# Patient Record
Sex: Female | Born: 1950 | Race: White | Hispanic: No | Marital: Married | Smoking: Never smoker
Health system: Southern US, Community
[De-identification: ages and names within clinical notes are randomized; demographics above are authoritative.]

## PROBLEM LIST (undated history)

## (undated) DIAGNOSIS — E669 Obesity, unspecified: Secondary | ICD-10-CM

## (undated) DIAGNOSIS — M47816 Spondylosis without myelopathy or radiculopathy, lumbar region: Secondary | ICD-10-CM

## (undated) DIAGNOSIS — I10 Essential (primary) hypertension: Secondary | ICD-10-CM

## (undated) DIAGNOSIS — J683 Other acute and subacute respiratory conditions due to chemicals, gases, fumes and vapors: Secondary | ICD-10-CM

## (undated) DIAGNOSIS — M1990 Unspecified osteoarthritis, unspecified site: Secondary | ICD-10-CM

## (undated) DIAGNOSIS — I2510 Atherosclerotic heart disease of native coronary artery without angina pectoris: Secondary | ICD-10-CM

## (undated) DIAGNOSIS — F32A Depression, unspecified: Secondary | ICD-10-CM

## (undated) DIAGNOSIS — F329 Major depressive disorder, single episode, unspecified: Secondary | ICD-10-CM

## (undated) DIAGNOSIS — M722 Plantar fascial fibromatosis: Secondary | ICD-10-CM

## (undated) HISTORY — DX: Essential (primary) hypertension: I10

## (undated) HISTORY — PX: OTHER SURGICAL HISTORY: SHX169

## (undated) HISTORY — DX: Major depressive disorder, single episode, unspecified: F32.9

## (undated) HISTORY — DX: Plantar fascial fibromatosis: M72.2

## (undated) HISTORY — DX: Depression, unspecified: F32.A

## (undated) HISTORY — DX: Unspecified osteoarthritis, unspecified site: M19.90

## (undated) HISTORY — PX: CARDIAC CATHETERIZATION: SHX172

## (undated) HISTORY — PX: APPENDECTOMY: SHX54

## (undated) HISTORY — DX: Atherosclerotic heart disease of native coronary artery without angina pectoris: I25.10

## (undated) HISTORY — DX: Obesity, unspecified: E66.9

## (undated) HISTORY — DX: Spondylosis without myelopathy or radiculopathy, lumbar region: M47.816

## (undated) HISTORY — DX: Other acute and subacute respiratory conditions due to chemicals, gases, fumes and vapors: J68.3

---

## 2004-06-01 ENCOUNTER — Encounter: Payer: Self-pay | Admitting: Family Medicine

## 2006-03-18 ENCOUNTER — Ambulatory Visit: Payer: Self-pay | Admitting: Family Medicine

## 2006-03-18 DIAGNOSIS — G4733 Obstructive sleep apnea (adult) (pediatric): Secondary | ICD-10-CM | POA: Insufficient documentation

## 2006-03-18 DIAGNOSIS — M1990 Unspecified osteoarthritis, unspecified site: Secondary | ICD-10-CM | POA: Insufficient documentation

## 2006-04-10 ENCOUNTER — Encounter: Payer: Self-pay | Admitting: Family Medicine

## 2006-04-17 ENCOUNTER — Encounter: Payer: Self-pay | Admitting: Family Medicine

## 2006-05-22 ENCOUNTER — Ambulatory Visit: Payer: Self-pay | Admitting: Family Medicine

## 2006-06-14 ENCOUNTER — Ambulatory Visit: Payer: Self-pay | Admitting: Family Medicine

## 2006-06-20 ENCOUNTER — Ambulatory Visit: Payer: Self-pay | Admitting: Family Medicine

## 2006-06-20 ENCOUNTER — Other Ambulatory Visit: Admission: RE | Admit: 2006-06-20 | Discharge: 2006-06-20 | Payer: Self-pay | Admitting: Family Medicine

## 2006-06-20 ENCOUNTER — Encounter: Payer: Self-pay | Admitting: Family Medicine

## 2006-06-21 ENCOUNTER — Encounter: Payer: Self-pay | Admitting: Family Medicine

## 2006-06-24 ENCOUNTER — Encounter: Payer: Self-pay | Admitting: Family Medicine

## 2006-06-25 ENCOUNTER — Telehealth: Payer: Self-pay | Admitting: Family Medicine

## 2006-06-25 LAB — CONVERTED CEMR LAB
Alkaline Phosphatase: 53 units/L (ref 39–117)
BUN: 29 mg/dL — ABNORMAL HIGH (ref 6–23)
CO2: 26 meq/L (ref 19–32)
Chloride: 107 meq/L (ref 96–112)
Glucose, Bld: 94 mg/dL (ref 70–99)
LDL Cholesterol: 109 mg/dL — ABNORMAL HIGH (ref 0–99)
Sodium: 144 meq/L (ref 135–145)
TSH: 0.721 microintl units/mL (ref 0.350–5.50)
Total CHOL/HDL Ratio: 3.9

## 2006-07-02 ENCOUNTER — Ambulatory Visit: Payer: Self-pay | Admitting: Family Medicine

## 2006-07-15 ENCOUNTER — Telehealth: Payer: Self-pay | Admitting: Family Medicine

## 2006-07-31 ENCOUNTER — Encounter: Payer: Self-pay | Admitting: Family Medicine

## 2006-09-24 ENCOUNTER — Ambulatory Visit: Payer: Self-pay | Admitting: Family Medicine

## 2006-09-27 ENCOUNTER — Telehealth: Payer: Self-pay | Admitting: Family Medicine

## 2006-11-18 ENCOUNTER — Ambulatory Visit: Payer: Self-pay | Admitting: Family Medicine

## 2006-11-19 ENCOUNTER — Encounter: Payer: Self-pay | Admitting: Family Medicine

## 2006-12-05 ENCOUNTER — Ambulatory Visit: Payer: Self-pay | Admitting: Family Medicine

## 2007-01-14 ENCOUNTER — Telehealth: Payer: Self-pay | Admitting: Family Medicine

## 2007-02-06 ENCOUNTER — Ambulatory Visit: Payer: Self-pay | Admitting: Family Medicine

## 2007-02-06 DIAGNOSIS — M654 Radial styloid tenosynovitis [de Quervain]: Secondary | ICD-10-CM | POA: Insufficient documentation

## 2007-02-11 ENCOUNTER — Encounter: Payer: Self-pay | Admitting: Family Medicine

## 2007-02-12 ENCOUNTER — Encounter: Payer: Self-pay | Admitting: Family Medicine

## 2007-03-27 ENCOUNTER — Ambulatory Visit: Payer: Self-pay | Admitting: Family Medicine

## 2007-03-31 ENCOUNTER — Telehealth: Payer: Self-pay | Admitting: Family Medicine

## 2007-04-10 ENCOUNTER — Telehealth: Payer: Self-pay | Admitting: Family Medicine

## 2007-08-12 LAB — CONVERTED CEMR LAB: Pap Smear: NORMAL

## 2007-10-29 ENCOUNTER — Ambulatory Visit: Payer: Self-pay | Admitting: Family Medicine

## 2007-10-29 DIAGNOSIS — J45909 Unspecified asthma, uncomplicated: Secondary | ICD-10-CM | POA: Insufficient documentation

## 2007-11-05 ENCOUNTER — Ambulatory Visit: Payer: Self-pay | Admitting: Family Medicine

## 2007-11-05 ENCOUNTER — Encounter: Admission: RE | Admit: 2007-11-05 | Discharge: 2007-11-05 | Payer: Self-pay | Admitting: Family Medicine

## 2007-11-20 ENCOUNTER — Telehealth: Payer: Self-pay | Admitting: Family Medicine

## 2007-11-21 ENCOUNTER — Encounter: Admission: RE | Admit: 2007-11-21 | Discharge: 2007-11-21 | Payer: Self-pay | Admitting: Family Medicine

## 2007-11-25 LAB — HM MAMMOGRAPHY: HM Mammogram: NORMAL

## 2008-03-11 ENCOUNTER — Ambulatory Visit: Payer: Self-pay | Admitting: Family Medicine

## 2008-03-19 ENCOUNTER — Ambulatory Visit: Payer: Self-pay | Admitting: Family Medicine

## 2008-03-31 LAB — HM COLONOSCOPY

## 2008-04-01 ENCOUNTER — Encounter: Payer: Self-pay | Admitting: Family Medicine

## 2008-04-07 ENCOUNTER — Ambulatory Visit: Payer: Self-pay | Admitting: Family Medicine

## 2008-04-07 ENCOUNTER — Encounter: Admission: RE | Admit: 2008-04-07 | Discharge: 2008-04-07 | Payer: Self-pay | Admitting: Family Medicine

## 2008-04-08 ENCOUNTER — Encounter: Payer: Self-pay | Admitting: Family Medicine

## 2008-04-08 LAB — CONVERTED CEMR LAB
AST: 24 units/L (ref 0–37)
Albumin: 4.7 g/dL (ref 3.5–5.2)
Alkaline Phosphatase: 59 units/L (ref 39–117)
Basophils Absolute: 0 10*3/uL (ref 0.0–0.1)
Chloride: 106 meq/L (ref 96–112)
Cholesterol: 213 mg/dL — ABNORMAL HIGH (ref 0–200)
Creatinine, Ser: 0.76 mg/dL (ref 0.40–1.20)
Eosinophils Absolute: 0.3 10*3/uL (ref 0.0–0.7)
Eosinophils Relative: 6 % — ABNORMAL HIGH (ref 0–5)
Hemoglobin: 14.2 g/dL (ref 12.0–15.0)
Lymphocytes Relative: 30 % (ref 12–46)
MCHC: 34 g/dL (ref 30.0–36.0)
Monocytes Relative: 8 % (ref 3–12)
Neutro Abs: 3.4 10*3/uL (ref 1.7–7.7)
Neutrophils Relative %: 55 % (ref 43–77)
Potassium: 4.2 meq/L (ref 3.5–5.3)
RBC: 4.94 M/uL (ref 3.87–5.11)
Sodium: 142 meq/L (ref 135–145)
Total Protein: 7.1 g/dL (ref 6.0–8.3)

## 2008-04-10 ENCOUNTER — Encounter: Admission: RE | Admit: 2008-04-10 | Discharge: 2008-04-10 | Payer: Self-pay | Admitting: Family Medicine

## 2008-04-12 ENCOUNTER — Encounter: Payer: Self-pay | Admitting: Family Medicine

## 2008-04-19 ENCOUNTER — Ambulatory Visit: Payer: Self-pay | Admitting: Family Medicine

## 2008-04-19 DIAGNOSIS — M4802 Spinal stenosis, cervical region: Secondary | ICD-10-CM | POA: Insufficient documentation

## 2008-05-17 ENCOUNTER — Telehealth: Payer: Self-pay | Admitting: Family Medicine

## 2008-05-18 ENCOUNTER — Ambulatory Visit: Payer: Self-pay | Admitting: Family Medicine

## 2008-06-16 ENCOUNTER — Encounter: Payer: Self-pay | Admitting: Family Medicine

## 2008-06-16 ENCOUNTER — Encounter: Admission: RE | Admit: 2008-06-16 | Discharge: 2008-06-16 | Payer: Self-pay | Admitting: Sports Medicine

## 2008-06-18 ENCOUNTER — Encounter: Admission: RE | Admit: 2008-06-18 | Discharge: 2008-07-23 | Payer: Self-pay | Admitting: Sports Medicine

## 2008-09-02 ENCOUNTER — Ambulatory Visit: Payer: Self-pay | Admitting: Family Medicine

## 2008-09-06 ENCOUNTER — Ambulatory Visit: Payer: Self-pay | Admitting: Family Medicine

## 2008-09-06 DIAGNOSIS — E669 Obesity, unspecified: Secondary | ICD-10-CM | POA: Insufficient documentation

## 2008-12-17 ENCOUNTER — Telehealth: Payer: Self-pay | Admitting: Family Medicine

## 2009-03-04 ENCOUNTER — Telehealth: Payer: Self-pay | Admitting: Family Medicine

## 2009-04-19 ENCOUNTER — Encounter: Admission: RE | Admit: 2009-04-19 | Discharge: 2009-04-19 | Payer: Self-pay | Admitting: Family Medicine

## 2009-04-19 ENCOUNTER — Telehealth (INDEPENDENT_AMBULATORY_CARE_PROVIDER_SITE_OTHER): Payer: Self-pay | Admitting: *Deleted

## 2009-04-19 ENCOUNTER — Ambulatory Visit: Payer: Self-pay | Admitting: Family Medicine

## 2009-04-19 DIAGNOSIS — M545 Low back pain, unspecified: Secondary | ICD-10-CM | POA: Insufficient documentation

## 2009-05-02 ENCOUNTER — Telehealth: Payer: Self-pay | Admitting: Family Medicine

## 2009-07-15 ENCOUNTER — Encounter: Admission: RE | Admit: 2009-07-15 | Discharge: 2009-07-15 | Payer: Self-pay | Admitting: Family Medicine

## 2009-07-15 ENCOUNTER — Ambulatory Visit: Payer: Self-pay | Admitting: Family Medicine

## 2009-07-15 DIAGNOSIS — R109 Unspecified abdominal pain: Secondary | ICD-10-CM | POA: Insufficient documentation

## 2009-07-15 LAB — CONVERTED CEMR LAB
Glucose, Urine, Semiquant: NEGATIVE
HCT: 40.2 % (ref 36.0–46.0)
Hemoglobin: 13.6 g/dL (ref 12.0–15.0)
Lymphocytes Relative: 31 % (ref 12–46)
MCV: 85.9 fL (ref 78.0–100.0)
Monocytes Absolute: 0.5 10*3/uL (ref 0.1–1.0)
Neutro Abs: 4 10*3/uL (ref 1.7–7.7)
Platelets: 172 10*3/uL (ref 150–400)
Protein, U semiquant: NEGATIVE
RBC: 4.68 M/uL (ref 3.87–5.11)
RDW: 12.2 % (ref 11.5–15.5)
Urobilinogen, UA: 0.2
WBC Urine, dipstick: NEGATIVE
pH: 7

## 2009-07-15 IMAGING — CR DG ABDOMEN 1V
1 series · 1 of 1 positions shown · non-contrast
Comparison: Plain film [DATE]

CLINICAL DATA: Right lower quadrant pain

ABDOMEN - 1 VIEW

[view not recorded]
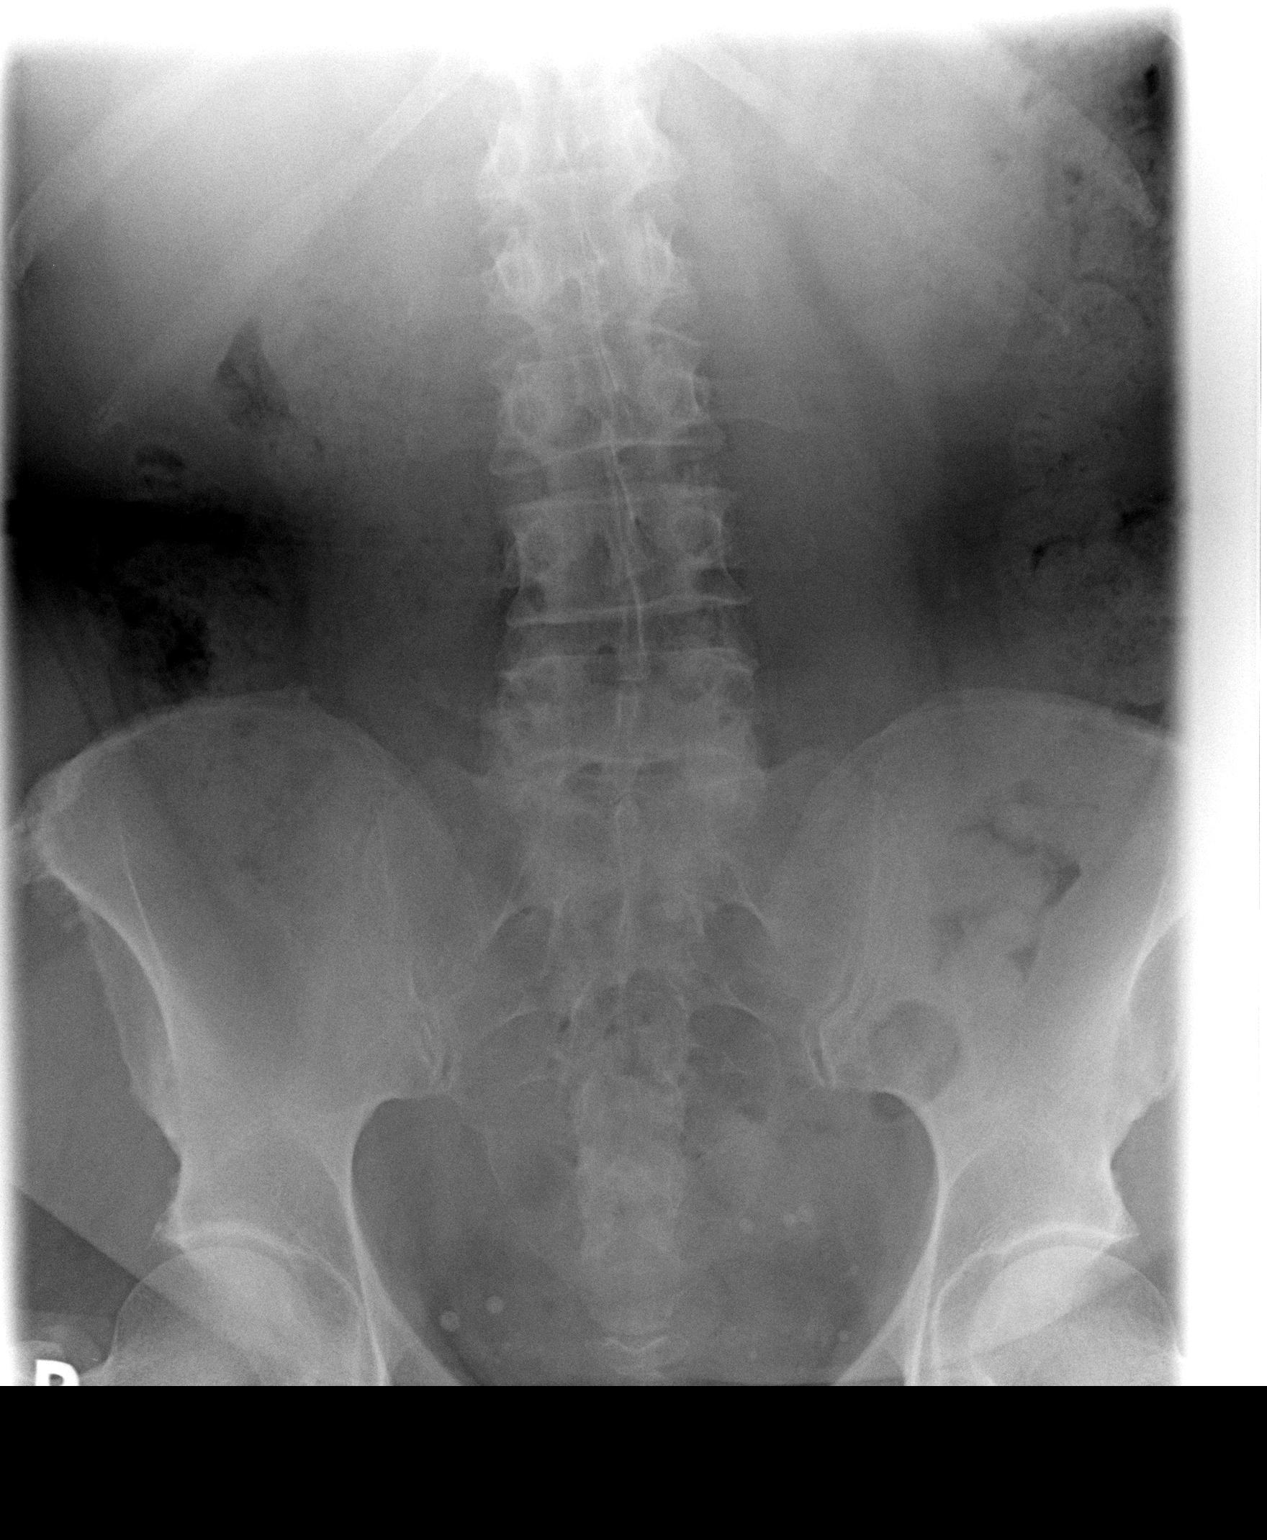

[1 of 1 positions shown; findings below may reference images not displayed]

FINDINGS: There is a moderate volume of stool within the ascending
and descending colon.  No stool in distal rectosigmoid colon.
There is mild dextroscoliosis of the lumbar spine.  Vascular
calcification in  lower pelvis.
IMPRESSION: Moderate volume of stool throughout the colon may represent
constipation.

## 2009-07-18 LAB — CONVERTED CEMR LAB
Albumin: 4.6 g/dL (ref 3.5–5.2)
Alkaline Phosphatase: 53 units/L (ref 39–117)
BUN: 18 mg/dL (ref 6–23)
CO2: 24 meq/L (ref 19–32)
Calcium: 9.8 mg/dL (ref 8.4–10.5)
Creatinine, Ser: 0.76 mg/dL (ref 0.40–1.20)
Glucose, Bld: 95 mg/dL (ref 70–99)
Potassium: 4.8 meq/L (ref 3.5–5.3)
Total Bilirubin: 0.5 mg/dL (ref 0.3–1.2)
Total Protein: 6.4 g/dL (ref 6.0–8.3)

## 2009-07-19 ENCOUNTER — Ambulatory Visit: Payer: Self-pay | Admitting: Family Medicine

## 2009-07-19 ENCOUNTER — Encounter: Admission: RE | Admit: 2009-07-19 | Discharge: 2009-07-19 | Payer: Self-pay | Admitting: Family Medicine

## 2009-08-16 ENCOUNTER — Other Ambulatory Visit: Admission: RE | Admit: 2009-08-16 | Discharge: 2009-08-16 | Payer: Self-pay | Admitting: Family Medicine

## 2009-08-16 ENCOUNTER — Ambulatory Visit: Payer: Self-pay | Admitting: Family Medicine

## 2009-08-16 DIAGNOSIS — I10 Essential (primary) hypertension: Secondary | ICD-10-CM | POA: Insufficient documentation

## 2009-08-19 LAB — CONVERTED CEMR LAB: Pap Smear: NEGATIVE

## 2009-08-23 ENCOUNTER — Encounter (INDEPENDENT_AMBULATORY_CARE_PROVIDER_SITE_OTHER): Payer: Self-pay | Admitting: *Deleted

## 2009-08-23 ENCOUNTER — Encounter: Admission: RE | Admit: 2009-08-23 | Discharge: 2009-08-23 | Payer: Self-pay | Admitting: Family Medicine

## 2009-08-30 ENCOUNTER — Ambulatory Visit: Payer: Self-pay | Admitting: Obstetrics & Gynecology

## 2009-09-06 ENCOUNTER — Ambulatory Visit: Payer: Self-pay | Admitting: Obstetrics & Gynecology

## 2009-09-20 ENCOUNTER — Ambulatory Visit: Payer: Self-pay | Admitting: Obstetrics & Gynecology

## 2009-09-21 ENCOUNTER — Encounter: Payer: Self-pay | Admitting: Obstetrics & Gynecology

## 2009-09-21 LAB — CONVERTED CEMR LAB: Yeast Wet Prep HPF POC: NONE SEEN

## 2009-11-21 ENCOUNTER — Encounter: Admission: RE | Admit: 2009-11-21 | Discharge: 2009-11-21 | Payer: Self-pay | Admitting: Obstetrics & Gynecology

## 2009-11-30 ENCOUNTER — Ambulatory Visit: Payer: Self-pay | Admitting: Obstetrics & Gynecology

## 2010-01-23 ENCOUNTER — Ambulatory Visit: Payer: Self-pay | Admitting: Obstetrics & Gynecology

## 2010-01-23 ENCOUNTER — Ambulatory Visit (HOSPITAL_COMMUNITY): Admission: RE | Admit: 2010-01-23 | Discharge: 2010-01-23 | Payer: Self-pay | Admitting: Obstetrics & Gynecology

## 2010-02-07 ENCOUNTER — Ambulatory Visit: Payer: Self-pay | Admitting: Obstetrics & Gynecology

## 2010-02-18 ENCOUNTER — Encounter: Payer: Self-pay | Admitting: Family Medicine

## 2010-03-08 ENCOUNTER — Ambulatory Visit: Payer: Self-pay | Admitting: Family Medicine

## 2010-03-08 DIAGNOSIS — Z8659 Personal history of other mental and behavioral disorders: Secondary | ICD-10-CM | POA: Insufficient documentation

## 2010-03-08 DIAGNOSIS — G44209 Tension-type headache, unspecified, not intractable: Secondary | ICD-10-CM | POA: Insufficient documentation

## 2010-03-16 ENCOUNTER — Ambulatory Visit: Payer: Self-pay | Admitting: Diagnostic Radiology

## 2010-03-16 ENCOUNTER — Ambulatory Visit: Payer: Self-pay | Admitting: Cardiovascular Disease

## 2010-03-16 ENCOUNTER — Encounter: Payer: Self-pay | Admitting: Emergency Medicine

## 2010-03-16 ENCOUNTER — Inpatient Hospital Stay (HOSPITAL_COMMUNITY): Admission: AD | Admit: 2010-03-16 | Discharge: 2010-03-17 | Payer: Self-pay | Admitting: Cardiovascular Disease

## 2010-03-27 ENCOUNTER — Ambulatory Visit: Payer: Self-pay | Admitting: Family Medicine

## 2010-03-27 DIAGNOSIS — R079 Chest pain, unspecified: Secondary | ICD-10-CM | POA: Insufficient documentation

## 2010-04-10 ENCOUNTER — Encounter: Payer: Self-pay | Admitting: Family Medicine

## 2010-04-14 ENCOUNTER — Ambulatory Visit: Payer: Self-pay | Admitting: Family Medicine

## 2010-04-14 DIAGNOSIS — J069 Acute upper respiratory infection, unspecified: Secondary | ICD-10-CM | POA: Insufficient documentation

## 2010-04-14 DIAGNOSIS — L509 Urticaria, unspecified: Secondary | ICD-10-CM | POA: Insufficient documentation

## 2010-04-17 ENCOUNTER — Encounter: Admission: RE | Admit: 2010-04-17 | Discharge: 2010-04-19 | Payer: Self-pay | Admitting: Orthopedic Surgery

## 2010-05-04 ENCOUNTER — Telehealth (INDEPENDENT_AMBULATORY_CARE_PROVIDER_SITE_OTHER): Payer: Self-pay | Admitting: *Deleted

## 2010-05-05 ENCOUNTER — Encounter: Payer: Self-pay | Admitting: Family Medicine

## 2010-05-08 LAB — CONVERTED CEMR LAB
Cholesterol: 172 mg/dL (ref 0–200)
Glucose, Bld: 95 mg/dL (ref 70–99)
Total CHOL/HDL Ratio: 3.1
Triglycerides: 122 mg/dL (ref <150)

## 2010-05-25 ENCOUNTER — Encounter: Payer: Self-pay | Admitting: Family Medicine

## 2010-06-07 ENCOUNTER — Encounter: Payer: Self-pay | Admitting: Family Medicine

## 2010-06-27 NOTE — Letter (Signed)
[facility]   Im[facility] By: Lanelle Bal 04/24/2010 10:06:01  _____________________________________________________________________  External Attachment:    Type:   Image     Comment:   External Document

## 2010-06-27 NOTE — Assessment & Plan Note (Signed)
Summary: URI   Vital Signs:  Patient profile:   60 year old female Height:      66.25 inches Weight:      222 pounds Temp:     98.5 degrees F oral Pulse rate:   60 / minute BP sitting:   119 / 66  (right arm) Cuff size:   large  Vitals Entered By: Avon Gully CMA, Duncan Dull) (April 14, 2010 1:44 PM) CC: cough since last thurday   Primary Care Provider:  Seymour Bars D.O.  CC:  cough since last thurday.  History of Present Illness: About 10 days ago had sinus pain and upper teeth pain so it finally resolved after using Tyelnol. Then a cough started about 8 days ago.  Got worse over the weekend. Seen about 5 days ago and put  amoxcicillin  and tessaslon perles. Last night felt feverish. Still coughing.  This morning felt she was wheezing and upper chest feels sore with A DEP BREATH. More on the right side.  Still on the ABX. No sinus sxs.  Using Delsym too.   Current Medications (verified): 1)  Ambien 10 Mg Tabs (Zolpidem Tartrate) .Marland Kitchen.. 1 Tab By Mouth At Bedtime As Needed Sleep 2)  Meloxicam 15 Mg Tabs (Meloxicam) .Marland Kitchen.. 1 Tab By Mouth Qam With Food 3)  Citalopram Hydrobromide 20 Mg Tabs (Citalopram Hydrobromide) .Marland Kitchen.. 1 Tab By Mouth Qhs 4)  Azor 5-20 Mg Tabs (Amlodipine-Olmesartan) .Marland Kitchen.. 1 Tab By Mouth Daily 5)  Pravachol 40 Mg Tabs (Pravastatin Sodium) .... Take 1 Tab By Mouth At Bedtime 6)  Aspirin 81 Mg Tbec (Aspirin) .... Take 1 Tab By Mouth Once Daily 7)  Fish Oil 1000 Mg Caps (Omega-3 Fatty Acids) .... Take 1 Capsule By Mouth Once Daily 8)  Flexeril 10 Mg Tabs (Cyclobenzaprine Hcl) .... Take 1 Tab By Mouth Once Daily 9)  Glucosamine 500 Mg Caps (Glucosamine Sulfate) .... Take 2 Tab By Mouth Once Daily 10)  Naproxen 250 Mg Tabs (Naproxen) .... Take 4 Tabs By Mouth Two Times A Day As Needed 11)  Tylenol Extra Strength 500 Mg Tabs (Acetaminophen) .... Take 1-2 Tabs By Mouth Every 6 Hours As Needed 12)  Amoxicillin 875 Mg Tabs (Amoxicillin) .... Take One Tablet By Mouth Two Times  A Day 13)  Tessalon Perles 100 Mg Caps (Benzonatate)  Allergies (verified): 1)  ! Alfonso Patten  Comments:  Nurse/Medical Assistant: The patient's medications and allergies were reviewed with the patient and were updated in the Medication and Allergy Lists. Avon Gully CMA, Duncan Dull) (April 14, 2010 1:47 PM)  Physical Exam  General:  Well-developed,well-nourished,in no acute distress; alert,appropriate and cooperative throughout examination Head:  Normocephalic and atraumatic without obvious abnormalities. No apparent alopecia or balding. Eyes:  No corneal or conjunctival inflammation noted. EOMI. Perrla. Ears:  External ear exam shows no significant lesions or deformities.  Otoscopic examination reveals clear canals, tympanic membranes are intact bilaterally without bulging, retraction, inflammation or discharge. Hearing is grossly normal bilaterally. Nose:  External nasal examination shows no deformity or inflammation. Nasal mucosa are pink and moist without lesions or exudates. Mouth:  Oral mucosa and oropharynx without lesions or exudates.  Teeth in good repair. Neck:  No deformities, masses, or tenderness noted. Lungs:  Normal respiratory effort, chest expands symmetrically. Lungs are clear to auscultation, no crackles or wheezes. Heart:  Normal rate and regular rhythm. S1 and S2 normal without gallop, murmur, click, rub or other extra sounds. Skin:  no rashes.  no rashes.   Cervical Nodes:  No lymphadenopathy noted Psych:  Cognition and judgment appear intact. Alert and cooperative with normal attention span and concentration. No apparent delusions, illusions, hallucinations   Impression & Recommendations:  Problem # 1:  URI (ICD-465.9) I let her know this is probably viral which is why she is not better on the amoxicillin.  Let her know ill likely take 3 weeks to resolved. Gave her cough med for bedtime. Can fil zpack if not better in one week since leaving town for  Thanksgiving.  Her updated medication list for this problem includes:    Meloxicam 15 Mg Tabs (Meloxicam) .Marland Kitchen... 1 tab by mouth qam with food    Aspirin 81 Mg Tbec (Aspirin) .Marland Kitchen... Take 1 tab by mouth once daily    Naproxen 250 Mg Tabs (Naproxen) .Marland Kitchen... Take 4 tabs by mouth two times a day as needed    Tylenol Extra Strength 500 Mg Tabs (Acetaminophen) .Marland Kitchen... Take 1-2 tabs by mouth every 6 hours as needed    Tessalon Perles 100 Mg Caps (Benzonatate)    Hydrocodone-homatropine 5-1.5 Mg/36ml Syrp (Hydrocodone-homatropine) .Marland KitchenMarland KitchenMarland KitchenMarland Kitchen 5ml by mouth at bedtime  Instructed on symptomatic treatment. Call if symptoms persist or worsen.   Her updated medication list for this problem includes:    Meloxicam 15 Mg Tabs (Meloxicam) .Marland Kitchen... 1 tab by mouth qam with food    Aspirin 81 Mg Tbec (Aspirin) .Marland Kitchen... Take 1 tab by mouth once daily    Naproxen 250 Mg Tabs (Naproxen) .Marland Kitchen... Take 4 tabs by mouth two times a day as needed    Tylenol Extra Strength 500 Mg Tabs (Acetaminophen) .Marland Kitchen... Take 1-2 tabs by mouth every 6 hours as needed    Tessalon Perles 100 Mg Caps (Benzonatate)    Hydrocodone-homatropine 5-1.5 Mg/29ml Syrp (Hydrocodone-homatropine) .Marland KitchenMarland KitchenMarland KitchenMarland Kitchen 5ml by mouth at bedtime  Complete Medication List: 1)  Ambien 10 Mg Tabs (Zolpidem tartrate) .Marland Kitchen.. 1 tab by mouth at bedtime as needed sleep 2)  Meloxicam 15 Mg Tabs (Meloxicam) .Marland Kitchen.. 1 tab by mouth qam with food 3)  Citalopram Hydrobromide 20 Mg Tabs (Citalopram hydrobromide) .Marland Kitchen.. 1 tab by mouth qhs 4)  Azor 5-20 Mg Tabs (Amlodipine-olmesartan) .Marland Kitchen.. 1 tab by mouth daily 5)  Pravachol 40 Mg Tabs (Pravastatin sodium) .... Take 1 tab by mouth at bedtime 6)  Aspirin 81 Mg Tbec (Aspirin) .... Take 1 tab by mouth once daily 7)  Fish Oil 1000 Mg Caps (Omega-3 fatty acids) .... Take 1 capsule by mouth once daily 8)  Flexeril 10 Mg Tabs (Cyclobenzaprine hcl) .... Take 1 tab by mouth once daily 9)  Glucosamine 500 Mg Caps (Glucosamine sulfate) .... Take 2 tab by mouth once  daily 10)  Naproxen 250 Mg Tabs (Naproxen) .... Take 4 tabs by mouth two times a day as needed 11)  Tylenol Extra Strength 500 Mg Tabs (Acetaminophen) .... Take 1-2 tabs by mouth every 6 hours as needed 12)  Amoxicillin 875 Mg Tabs (Amoxicillin) .... Take one tablet by mouth two times a day 13)  Tessalon Perles 100 Mg Caps (Benzonatate) 14)  Hydrocodone-homatropine 5-1.5 Mg/33ml Syrp (Hydrocodone-homatropine) .... 5ml by mouth at bedtime 15)  Zithromax Z-pak 250 Mg Tabs (Azithromycin) .... Take as directed. Prescriptions: ZITHROMAX Z-PAK 250 MG TABS (AZITHROMYCIN) Take as directed.  #1 pack x 0   Entered and Authorized by:   Nani Gasser MD   Signed by:   Nani Gasser MD on 04/14/2010   Method used:   Print then Give to Patient   RxID:   [id number] HYDROCODONE-HOMATROPINE 5-1.5  MG/5ML SYRP (HYDROCODONE-HOMATROPINE) 5ml by mouth at bedtime  #150 ml x 0   Entered and Authorized by:   Nani Gasser MD   Signed by:   Nani Gasser MD on 04/14/2010   Method used:   Printed then faxed to ...       [facility]* (retail)       [facility] [place], [place]  [address]       Ph: [phone]       Fax: [phone]   RxID:   [id number]    Orders Added: 1)  Est. Patient Level III [70623]

## 2010-06-27 NOTE — Assessment & Plan Note (Signed)
Summary: HFU for chest pain   Vital Signs:  Patient profile:   60 year old female Height:      66.25 inches Weight:      217 pounds BMI:     34.89 O2 Sat:      95 % on Room air Pulse rate:   65 / minute BP sitting:   130 / 65  (left arm) Cuff size:   large  Vitals Entered By: Payton Spark CMA (March 27, 2010 1:44 PM)  O2 Flow:  Room air CC: Hosp F/U. On Prednisone taper and ABX per podiatrist.    Primary Care Provider:  Seymour Bars D.O.  CC:  Hosp F/U. On Prednisone taper and ABX per podiatrist. .  History of Present Illness: 60 yo WF presents for HFU visit.  She was admitted to [facility] 10-20 to 10/21 for chest pain.  She was cathed on 10-20 and she had mild coronary plaquing with normal LV function.  She was started on Pravastatin.  The event was felt to be from coronary or esophageal spasm.    CCB was considered, but her HR was too low. She is feeling well now.  Denies CP, DOE, leg swelling, palpitations or heartburn.  She'd like to restart an exercise program.  Still having a little pain at her cath site.   Waist Circ 41"   Current Medications (verified): 1)  Ambien 10 Mg Tabs (Zolpidem Tartrate) .Marland Kitchen.. 1 Tab By Mouth At Bedtime As Needed Sleep 2)  Meloxicam 15 Mg Tabs (Meloxicam) .Marland Kitchen.. 1 Tab By Mouth Qam With Food 3)  Citalopram Hydrobromide 20 Mg Tabs (Citalopram Hydrobromide) .Marland Kitchen.. 1 Tab By Mouth Qhs 4)  Azor 5-20 Mg Tabs (Amlodipine-Olmesartan) .Marland Kitchen.. 1 Tab By Mouth Daily 5)  Pravachol 40 Mg Tabs (Pravastatin Sodium) .... Take 1 Tab By Mouth At Bedtime 6)  Aspirin 81 Mg Tbec (Aspirin) .... Take 1 Tab By Mouth Once Daily 7)  Fish Oil 1000 Mg Caps (Omega-3 Fatty Acids) .... Take 1 Capsule By Mouth Once Daily 8)  Flexeril 10 Mg Tabs (Cyclobenzaprine Hcl) .... Take 1 Tab By Mouth Once Daily 9)  Glucosamine 500 Mg Caps (Glucosamine Sulfate) .... Take 2 Tab By Mouth Once Daily 10)  Naproxen 250 Mg Tabs (Naproxen) .... Take 4 Tabs By Mouth Two Times A Day As Needed 11)   Tylenol Extra Strength 500 Mg Tabs (Acetaminophen) .... Take 1-2 Tabs By Mouth Every 6 Hours As Needed  Allergies (verified): 1)  ! Alfonso Patten  Past History:  Past Medical History: Osteoarthritis (03/18/2006) reactive airways Lumbar spondylosis depression HTN obesity plantar fasciitis - Dr Sherilyn Cooter in [facility]  Past Surgical History: Appendectomy LTCS L ORIF T&A D&C with hysteroscopy 12-2009 heart cath (Dr Excell Seltzer ) 03-16-2010, mild CAD, normal LV function  Social History: Reviewed history from 03/08/2010 and no changes required. 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  Has associates degree and is married to [place].  Has 3 grown children.  Nonsmoker.  Occas ETOH.  Does cardio and wt lifing 3 x a wk.  Review of Systems      See HPI  Physical Exam  General:  alert, well-developed, well-nourished, and well-hydrated.  obese Head:  normocephalic and atraumatic.   Mouth:  good dentition and pharynx pink and moist.   Lungs:  Normal respiratory effort, chest expands symmetrically. Lungs are clear to auscultation, no crackles or wheezes. Heart:  Normal rate and regular rhythm. S1 and S2 normal without gallop, murmur, click, rub or other extra sounds. Pulses:  2+ radial  pulses Extremities:  no LE edema Skin:  color normal.  cath site R groin, normal with a small resolving hematoma, ecchymoses Cervical Nodes:  No lymphadenopathy noted Psych:  good eye contact, not anxious appearing, and not depressed appearing.     Impression & Recommendations:  Problem # 1:  CHEST PAIN (ICD-786.50) Assessment Improved Reviewed hosp notes and cath notes from 03-16-10. She had mild CAD plaquing with normal LV function.  No further cardiac w/ u recommended. Felt to be either coronary vasospasm or esophageal spasm but her HR was too low for a CCB. She has the green light to exercise. Needs to work on wt loss and cholesterol reduction with LDL goal of <100. Will get labs updated in 2 mos-- on Pravastatin now. BP at goal.     Complete Medication List: 1)  Ambien 10 Mg Tabs (Zolpidem tartrate) .Marland Kitchen.. 1 tab by mouth at bedtime as needed sleep 2)  Meloxicam 15 Mg Tabs (Meloxicam) .Marland Kitchen.. 1 tab by mouth qam with food 3)  Citalopram Hydrobromide 20 Mg Tabs (Citalopram hydrobromide) .Marland Kitchen.. 1 tab by mouth qhs 4)  Azor 5-20 Mg Tabs (Amlodipine-olmesartan) .Marland Kitchen.. 1 tab by mouth daily 5)  Pravachol 40 Mg Tabs (Pravastatin sodium) .... Take 1 tab by mouth at bedtime 6)  Aspirin 81 Mg Tbec (Aspirin) .... Take 1 tab by mouth once daily 7)  Fish Oil 1000 Mg Caps (Omega-3 fatty acids) .... Take 1 capsule by mouth once daily 8)  Flexeril 10 Mg Tabs (Cyclobenzaprine hcl) .... Take 1 tab by mouth once daily 9)  Glucosamine 500 Mg Caps (Glucosamine sulfate) .... Take 2 tab by mouth once daily 10)  Naproxen 250 Mg Tabs (Naproxen) .... Take 4 tabs by mouth two times a day as needed 11)  Tylenol Extra Strength 500 Mg Tabs (Acetaminophen) .... Take 1-2 tabs by mouth every 6 hours as needed  Patient Instructions: 1)  OK to start exercise program. 2)  Use ice packs for R groin pain/ resolving hematoma. 3)  Stay on current meds. 4)  Call for lab order form once off Prednisone. 5)  REturn for f/u cholesterol / BP in 2 mos.   Orders Added: 1)  Est. Patient Level III [16109]

## 2010-06-27 NOTE — Letter (Signed)
Summary: Primary Care Consult Scheduled Letter  [place] [facility]  [address]   [place], [place] [address]   Phone: [phone]  Fax: [phone]      08/23/2009 MRN: [id number]  ABIE KILLIAN [address] [place], [place]  [address]   Dear Ms. Mask,     We have scheduled an appointment for you.  At the recommendation of Dr.BOWEN , we have scheduled you a consult with [facility], [place], DR LEGGETT on APRIL  5,2011 at [facility] .  Their address is_[address],[place]   . The office phone number is [phone].  If this appointment day and time is not convenient for you, please feel free to call the office of the doctor you are being referred to at the number listed above and reschedule the appointment.     It is important for you to keep your scheduled appointments. We are here to make sure you are given good patient care.   Thank you, Darral Dash Patient Care Coordinator [place]

## 2010-06-27 NOTE — Assessment & Plan Note (Signed)
Summary: mood/ BP   Vital Signs:  Patient profile:   60 year old female Height:      66.25 inches Weight:      222 pounds BMI:     35.69 O2 Sat:      96 % on Room air Pulse rate:   66 / minute BP sitting:   176 / 92  (left arm) Cuff size:   large  Vitals Entered By: Payton Spark CMA (March 08, 2010 3:26 PM)  O2 Flow:  Room air CC: Increased stress, elevated blood pressure and HAs.    Primary Care Demetri Goshert:  Seymour Bars D.O.  CC:  Increased stress and elevated blood pressure and HAs. .  History of Present Illness: 60 yo WF presents for elevated BP.  She has been under a lot of stress at work.  She stopped Benazepril because she thought it wasn't working.    She is getting HAs while at work from stress and high BPs.  She is not exercising.  Her diet has been fair but she has not been working on it.  Denies blurry vision, leg edema or heart palpitations.  She enjoys her weekends but admits to being more irritable and having poor sleep.  It has started to affect her relationships at home and at work.      Current Medications (verified): 1)  Ambien 10 Mg Tabs (Zolpidem Tartrate) .Marland Kitchen.. 1 Tab By Mouth At Bedtime As Needed Sleep 2)  Meloxicam 15 Mg Tabs (Meloxicam) .Marland Kitchen.. 1 Tab By Mouth Qam With Food  Allergies (verified): 1)  ! Alfonso Patten  Past History:  Past Medical History: Osteoarthritis (03/18/2006) reactive airways Lumbar spondylosis depression HTN obesity  Past Surgical History: Appendectomy LTCS L ORIF T&A D&C with hysteroscopy 12-2009  Social History: Charity fundraiser for [employer].  Has associates degree and is married to [place].  Has 3 grown children.  Nonsmoker.  Occas ETOH.  Does cardio and wt lifing 3 x a wk.  Review of Systems Psych:  Complains of anxiety, depression, easily angered, and irritability; denies easily tearful and suicidal thoughts/plans.  Physical Exam  General:  alert, well-developed, well-nourished, and well-hydrated.  obese Head:  normocephalic and  atraumatic.   Mouth:  pharynx pink and moist.   Neck:  no masses.   Lungs:  Normal respiratory effort, chest expands symmetrically. Lungs are clear to auscultation, no crackles or wheezes. Heart:  Normal rate and regular rhythm. S1 and S2 normal without gallop, murmur, click, rub or other extra sounds.   Impression & Recommendations:  Problem # 1:  ESSENTIAL HYPERTENSION, BENIGN (ICD-401.1) BP very high.  Leading to HAs.  Treat with Azor once daily.  Will bring in labs from work.  RTC in 6 wks for f/u. The following medications were removed from the medication list:    Benazepril Hcl 10 Mg Tabs (Benazepril hcl) .Marland Kitchen... 1 tab by mouth daily Her updated medication list for this problem includes:    Azor 5-20 Mg Tabs (Amlodipine-olmesartan) .Marland Kitchen... 1 tab by mouth daily  BP today: 176/92 Prior BP: 144/82 (08/16/2009)  Labs Reviewed: K+: 4.8 (07/15/2009) Creat: : 0.76 (07/15/2009)   Chol: 213 (04/08/2008)   HDL: 42 (04/08/2008)   LDL: 133 (04/08/2008)   TG: 191 (04/08/2008)  Problem # 2:  DEPRESSION, RECURRENT (ICD-311) verbally, Pam screens + for depression with overwhelming anxiety, irritability and insomnia. Will treat with Citalopram once daily.  Call if any problems.  RTC in 6 wks.  Her updated medication list for this problem includes:  Citalopram Hydrobromide 20 Mg Tabs (Citalopram hydrobromide) .Marland Kitchen... 1 tab by mouth qhs  Problem # 3:  TENSION HEADACHE (ICD-307.81) Expect improvements with reduction of stress. Her updated medication list for this problem includes:    Meloxicam 15 Mg Tabs (Meloxicam) .Marland Kitchen... 1 tab by mouth qam with food  Complete Medication List: 1)  Ambien 10 Mg Tabs (Zolpidem tartrate) .Marland Kitchen.. 1 tab by mouth at bedtime as needed sleep 2)  Meloxicam 15 Mg Tabs (Meloxicam) .Marland Kitchen.. 1 tab by mouth qam with food 3)  Citalopram Hydrobromide 20 Mg Tabs (Citalopram hydrobromide) .Marland Kitchen.. 1 tab by mouth qhs 4)  Azor 5-20 Mg Tabs (Amlodipine-olmesartan) .Marland Kitchen.. 1 tab by mouth  daily  Patient Instructions: 1)  Start Citalopram ONE HALF tab by mouth at night for the first wk for mood.  Then go up to a FULL TAB once daily. 2)  Call me if any problems from the new medicine. 3)  For BP, start on AZOR 1 tab by mouth daily. 4)  Fax or bring in a copy of labs from work. 5)  Work on increasing exercise. 6)  REturn for f/u BP/ mood in 6 wks. Prescriptions: AZOR 5-20 MG TABS (AMLODIPINE-OLMESARTAN) 1 tab by mouth daily  #30 x 2   Entered and Authorized by:   Seymour Bars DO   Signed by:   Seymour Bars DO on 03/08/2010   Method used:   Print then Give to Patient   RxID:   [id number] CITALOPRAM HYDROBROMIDE 20 MG TABS (CITALOPRAM HYDROBROMIDE) 1 tab by mouth qhs  #90 x 0   Entered and Authorized by:   Seymour Bars DO   Signed by:   Seymour Bars DO on 03/08/2010   Method used:   Electronically to        [facility]* (retail)       [facility] [place], [place]  [address]       Ph: [phone]       Fax: [phone]   RxID:   [id number]    Influenza Immunization History:    Influenza # 1:  Historical (01/26/2010)

## 2010-06-27 NOTE — Assessment & Plan Note (Signed)
Summary: STOMACH AND BACK PAIN   Vital Signs:  Patient profile:   60 year old female Height:      66.25 inches Weight:      220 pounds BMI:     35.37 O2 Sat:      97 % on Room air Temp:     97.0 degrees F Pulse rate:   58 / minute BP sitting:   154 / 79  (right arm) Cuff size:   regular  Vitals Entered BySelena Batten Johnson/April (July 15, 2009 4:10 PM)  O2 Flow:  Room air CC: c/o of lower abdominal pain, feeling of pressure in her thighs   Primary Care Provider:  Seymour Bars D.O.  CC:  c/o of lower abdominal pain and feeling of pressure in her thighs.  History of Present Illness: c/o of lower abdominal pain, feeling of pressure in her thighs.  No dysuria.  Pain started this week and got worse yesterday.  Constant achey discomfort but will intensify.  Will occ radiate to her back and then will get cramping into her upper thighs.  Feels like when would have her period with cramps. Haver her uterus.  Hx on constipation. No fever. Last BM was days ago.  No blood in the stool but some on the paper.  Some nausea. No hx of kideny stones.  No hx of pelvic prolapse. No vaginal d/c.  Postmenopuasal, not bleeding.   Current Medications (verified): 1)  Ambien 10 Mg Tabs (Zolpidem Tartrate) .Marland Kitchen.. 1 Tab By Mouth At Bedtime As Needed Sleep 2)  Meloxicam 15 Mg Tabs (Meloxicam) .Marland Kitchen.. 1 Tab By Mouth Qam With Food 3)  Albuterol Sulfate (2.5 Mg/85ml) 0.083%  Nebu (Albuterol Sulfate) .... 3 Ml in [employer] 4 X A Day As Needed 4)  Flexeril 10 Mg Tabs (Cyclobenzaprine Hcl) .... Take 1 Tablet By Mouth Once A Day At Bedtime As Needed Muscle Spasm 5)  Vicodin 5-500 Mg Tabs (Hydrocodone-Acetaminophen) .Marland Kitchen.. 1-2 Tabs By Mouth Two Times A Day As Needed Severe Back Pain  Allergies (verified): 1)  ! Alfonso Patten  Family History: Reviewed history from 04/10/2006 and no changes required. father died of pancreatic cancer; had DM and HTN mother died of Alzheimers and had breast cancer in her 85s older brother died  of pancreatic cancer younger brother alive with alcoholism  Physical Exam  General:  Well-developed,well-nourished,in no acute distress; alert,appropriate and cooperative throughout examination Head:  Normocephalic and atraumatic without obvious abnormalities. No apparent alopecia or balding. Lungs:  Normal respiratory effort, chest expands symmetrically. Lungs are clear to auscultation, no crackles or wheezes. Heart:  Normal rate and regular rhythm. S1 and S2 normal without gallop, murmur, click, rub or other extra sounds. Abdomen:  soft, normal bowel sounds, no distention, no masses, no hepatomegaly, and no splenomegaly.  Tender over the right and left lower quadrants with deep palpation.   Pulses:  Radial 2+  Skin:  no rashes.   Psych:  Cognition and judgment appear intact. Alert and cooperative with normal attention span and concentration. No apparent delusions, illusions, hallucinations   Impression & Recommendations:  Problem # 1:  PELVIC  PAIN (ICD-789.09) Assessment New Call cell [phone] numbner. Unlikel UTI, stones or pelvic prolapse based on her hx.  Likely obstipation.  Consider enema to try to relieve her sxs.  Also consider amitiza on a more chronic basis for her constipation. Will get KUB.  Will also get stat CBC to rule out infection. Will check liver and pancreatic enzymes thought likely to  be normal since her pain is really in the lower abdomen though it does radiate to the flanks.   Her updated medication list for this problem includes:    Meloxicam 15 Mg Tabs (Meloxicam) .Marland Kitchen... 1 tab by mouth qam with food    Flexeril 10 Mg Tabs (Cyclobenzaprine hcl) .Marland Kitchen... Take 1 tablet by mouth once a day at bedtime as needed muscle spasm    Vicodin 5-500 Mg Tabs (Hydrocodone-acetaminophen) .Marland Kitchen... 1-2 tabs by mouth two times a day as needed severe back pain  Orders: T-CBC w/Diff (16109-60454) T-*Unlisted Diagnostic X-ray test/procedure (09811) T-Comprehensive Metabolic Panel  ([id number]) T-Amylase (13086-57846) T-Lipase (96295-28413)  Complete Medication List: 1)  Ambien 10 Mg Tabs (Zolpidem tartrate) .Marland Kitchen.. 1 tab by mouth at bedtime as needed sleep 2)  Meloxicam 15 Mg Tabs (Meloxicam) .Marland Kitchen.. 1 tab by mouth qam with food 3)  Albuterol Sulfate (2.5 Mg/49ml) 0.083% Nebu (Albuterol sulfate) .... 3 ml in neb machine 4 x a day as needed 4)  Flexeril 10 Mg Tabs (Cyclobenzaprine hcl) .... Take 1 tablet by mouth once a day at bedtime as needed muscle spasm 5)  Vicodin 5-500 Mg Tabs (Hydrocodone-acetaminophen) .Marland Kitchen.. 1-2 tabs by mouth two times a day as needed severe back pain  Other Orders: UA Dipstick w/o Micro (automated)  (81003)  Laboratory Results   Urine Tests    Routine Urinalysis   Color: yellow Appearance: Clear Glucose: negative   (Normal Range: Negative) Bilirubin: negative   (Normal Range: Negative) Ketone: negative   (Normal Range: Negative) Spec. Gravity: 1.015   (Normal Range: 1.003-1.035) Blood: negative   (Normal Range: Negative) pH: 7.0   (Normal Range: 5.0-8.0) Protein: negative   (Normal Range: Negative) Urobilinogen: 0.2   (Normal Range: 0-1) Nitrite: negative   (Normal Range: Negative) Leukocyte Esterace: negative   (Normal Range: Negative)

## 2010-06-27 NOTE — Letter (Signed)
[facility]   Imported By: Lanelle Bal 03/10/2010 10:01:46  _____________________________________________________________________  External Attachment:    Type:   Image     Comment:   External Document

## 2010-06-27 NOTE — Assessment & Plan Note (Signed)
Summary: pelvic pain   Vital Signs:  Patient profile:   60 year old female Height:      66.25 inches Weight:      219 pounds BMI:     35.21 O2 Sat:      95 % on Room air Temp:     98.0 degrees F oral Pulse rate:   62 / minute BP sitting:   140 / 80  (left arm) Cuff size:   large  Vitals Entered By: Payton Spark CMA (July 19, 2009 1:05 PM)  O2 Flow:  Room air CC: F/U from 07/15/09. Not feeling any better.    Primary Care Provider:  Seymour Bars D.O.  CC:  F/U from 07/15/09. Not feeling any better. Marland Kitchen  History of Present Illness: 60 yo WF presents for worsening abdominal pain -- migrating to the LLQ.  She has constatnt pelvic aching with sharp pains on the LL side.  Not worse with position changes or with eating. No change with BMs.  She tried a bowel regimen after her visit here last wk which showed a KUB with moderate stool retention and normal labs.  She used an enema.  She is still having hard stools.  Her pain is not radiating to the back.  No vaginal bleeding.  She still has her uterus.  Last pap 1.5 yrs.  No vaginal discharge.  No urinary symptoms.  Her UA last visit was normal.  No fevers.  She has not been hungry.  She has some waves or nausea, no vomitting.    She had a colonoscopy about 2 yrs ago.  She had some polyps.     Current Medications (verified): 1)  Ambien 10 Mg Tabs (Zolpidem Tartrate) .Marland Kitchen.. 1 Tab By Mouth At Bedtime As Needed Sleep 2)  Meloxicam 15 Mg Tabs (Meloxicam) .Marland Kitchen.. 1 Tab By Mouth Qam With Food 3)  Amitiza 24 Mcg Caps (Lubiprostone) .... Take 1 Tablet By Mouth Two Times A Day  Allergies (verified): 1)  ! Alfonso Patten  Past History:  Past Medical History: Reviewed history from 04/19/2009 and no changes required. Osteoarthritis (03/18/2006) reactive airways Lumbar spondylosis  Past Surgical History: Reviewed history from 04/10/2006 and no changes required. Appendectomy LTCS L ORIF T&A  Social History: Reviewed history from 04/10/2006 and  no changes required. 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  Has 3 grown children.  Nonsmoker.  Occas ETOH.  Does cardio and wt lifing 3 x a wk.  Review of Systems General:  Complains of loss of appetite; denies chills, fatigue, fever, and weight loss. CV:  Denies chest pain or discomfort, shortness of breath with exertion, and swelling of feet. Resp:  Denies cough. GI:  Complains of abdominal pain, change in bowel habits, constipation, loss of appetite, and nausea; denies bloody stools, dark tarry stools, diarrhea, gas, indigestion, and vomiting. GU:  Denies dysuria. MS:  Complains of low back pain.  Physical Exam  General:  alert, well-developed, well-nourished, and well-hydrated.  obese in NAD Head:  normocephalic and atraumatic.   Eyes:  sclera non icteric Nose:  no nasal discharge.   Mouth:  good dentition and pharynx pink and moist.   Neck:  no masses.   Lungs:  Normal respiratory effort, chest expands symmetrically. Lungs are clear to auscultation, no crackles or wheezes. Heart:  Normal rate and regular rhythm. S1 and S2 normal without gallop, murmur, click, rub or other extra sounds. Abdomen:  soft.  ND.  no HSM.  NABS.  suprabupic and LLQ voluntary guarding to deep palpation.  no CVAT.  abdomen feels 'doughy' with stool Extremities:  trace LE edema Skin:  color normal.   Cervical Nodes:  No lymphadenopathy noted Psych:  good eye contact, not anxious appearing, and not depressed appearing.     Impression & Recommendations:  Problem # 1:  PELVIC  PAIN (ICD-789.09) LLQ/ suprapubic pain getting worse and not better over the last 5 days even w/ fairly good bowel regimen and some production of hard stools.  Reviewed her labs which were normal.  Will procede with CT abd/ pelvis to r/o obstruction, diverticulitis, Ovarian or uterine cause for pain.  Clear liquids, high fiber diet and change to Lactulose for constipation for now.  hemodynamically stable and in  no acute distress at present time.  If CT is normal, will bring her back for pap smear and bimanual exam.  She had a colonooscpy at [facility] 2 yrs ago.   The following medications were removed from the medication list:    Flexeril 10 Mg Tabs (Cyclobenzaprine hcl) .Marland Kitchen... Take 1 tablet by mouth once a day at bedtime as needed muscle spasm    Vicodin 5-500 Mg Tabs (Hydrocodone-acetaminophen) .Marland Kitchen... 1-2 tabs by mouth two times a day as needed severe back pain Her updated medication list for this problem includes:    Meloxicam 15 Mg Tabs (Meloxicam) .Marland Kitchen... 1 tab by mouth qam with food  Orders: T-CT Abdomen w/ Dye (74160TC) T-CT Pelvis w/ Dye (04540JW)  Complete Medication List: 1)  Ambien 10 Mg Tabs (Zolpidem tartrate) .Marland Kitchen.. 1 tab by mouth at bedtime as needed sleep 2)  Meloxicam 15 Mg Tabs (Meloxicam) .Marland Kitchen.. 1 tab by mouth qam with food 3)  Amitiza 24 Mcg Caps (Lubiprostone) .... Take 1 tablet by mouth two times a day 4)  Lactulose 10 Gm/40ml Soln (Lactulose) .Marland Kitchen.. 15 ml by mouth two times a day  Patient Instructions: 1)  CT Abdomen/ Pelvis today. 2)  Will call you w/ results tomorrow. 3)  Drink lots of water and stay on a high fiber diet. 4)  Start Lactulose 15 ml 2 x a day for constipation along with Colace 2 x a day (stool softener). 5)  If CT is normal, return for a pelvic exam with pap smear in the next 2 wks.   Prescriptions: LACTULOSE 10 GM/15ML SOLN (LACTULOSE) 15 ml by mouth two times a day  #473 ml x 0   Entered and Authorized by:   Seymour Bars DO   Signed by:   Seymour Bars DO on 07/19/2009   Method used:   [facility] to        [facility] [phone]* (retail)       [address]       [place], [place]  [address]       Ph: [phone] or [phone]       Fax: [phone]   RxID:   [id number]

## 2010-06-27 NOTE — Assessment & Plan Note (Signed)
Summary: pelvic pain/ BP   Vital Signs:  Patient profile:   60 year old female Height:      66.25 inches Weight:      221 pounds BMI:     35.53 O2 Sat:      96 % on Room air Temp:     98.5 degrees F oral Pulse rate:   58 / minute BP sitting:   144 / 82  (left arm) Cuff size:   large  Vitals Entered By: Payton Spark CMA (August 16, 2009 4:22 PM)  O2 Flow:  Room air CC: Pap smear   Primary Care Provider:  Seymour Bars D.O.  CC:  Pap smear.  History of Present Illness: Still having pain. In the same place, LLQ. Dull pain most of the time. Pains aren't as sharp as they were before, more burning, don't take her breath away. Anywhere from 1-10 times per day. Still no change with eating. She is now having regular bowel movements, one per day. Softer than they were before. She is taking stimulant-free Colace. She has not had to take Lactulose in over a week. Has not been taking her Amitiza or calcium. Still no vaginal bleeding, discharge, dysuria, or fevers. No further waves of nausea, no vomiting.   Back pain, leg pain, and other symptoms of sciatica have resolved.   Current Medications (verified): 1)  Ambien 10 Mg Tabs (Zolpidem Tartrate) .Marland Kitchen.. 1 Tab By Mouth At Bedtime As Needed Sleep 2)  Meloxicam 15 Mg Tabs (Meloxicam) .Marland Kitchen.. 1 Tab By Mouth Qam With Food 3)  Amitiza 24 Mcg Caps (Lubiprostone) .... Take 1 Tablet By Mouth Two Times A Day 4)  Lactulose 10 Gm/67ml Soln (Lactulose) .Marland Kitchen.. 15 Ml By Mouth Two Times A Day  Allergies (verified): 1)  ! Alfonso Patten  Past History:  Past Medical History: Reviewed history from 04/19/2009 and no changes required. Osteoarthritis (03/18/2006) reactive airways Lumbar spondylosis  Social History: Reviewed history from 04/10/2006 and no changes required. 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  Has 3 grown children.  Nonsmoker.  Occas ETOH.  Does cardio and wt lifing 3 x a wk.  Review of Systems      See HPI  Physical  Exam  General:  Pleasant obese-appearing female in no acute distress.  Head:  Normocephalic and atraumatic.  Eyes:   Sclera clear with no corneal or conjunctival inflammation noted. Nose:  No rhinorrhea.  Mouth:  Oropharynx clear with no lesions or exudate.  Neck:  Supple with no lymphadenopathy.  Lungs:  Normal respiratory effort, chest expands symmetrically. Lungs are clear to auscultation, no crackles or wheezes. Heart:  Normal rate and regular rhythm. S1 and S2 normal without gallop, murmur, click, rub or other extra sounds. Abdomen:  Soft and nondistended. Subjective LLQ tenderness. No hepatomegaly or splenomegaly. Normoactive bowel sounds.  No R/G/R Genitalia:  L adnexal tenderness.  no palpable masses. vaginal atrophy with normal appearing cervix. thin prep pap obtained.  no discharge or bleeding. Pulses:  Radial pulses 2+ bilaterally. Extremities:  no LE edema Skin:  color normal.   Psych:  Interacting appropriately with normal concentration and attention. Not anxious or depressed-appearing.    Impression & Recommendations:  Problem # 1:  PELVIC  PAIN (ICD-789.09) Will schedule transvaginal ultrasound given continued pain and L adnexal tenderness on exam with a normal CT of the abdomen and pelivs and failure to completely resolve once bowels improved.. Continue daily meloxicam and use OTC medications as  needed for pain relief.   Her updated medication list for this problem includes:    Meloxicam 15 Mg Tabs (Meloxicam) .Marland Kitchen... 1 tab by mouth qam with food  Orders: T-*Unlisted Diagnostic X-ray test/procedure (04540)  Problem # 2:  ESSENTIAL HYPERTENSION, BENIGN (ICD-401.1) BP 144/82 today, 140/80 last visit. Will start benazepril today. Counseled pt on healthy diet and weight loss. Follow up in 6 weeks for BP check and BMP.  Her updated medication list for this problem includes:    Benazepril Hcl 10 Mg Tabs (Benazepril hcl) .Marland Kitchen... 1 tab by mouth daily  Complete Medication  List: 1)  Ambien 10 Mg Tabs (Zolpidem tartrate) .Marland Kitchen.. 1 tab by mouth at bedtime as needed sleep 2)  Meloxicam 15 Mg Tabs (Meloxicam) .Marland Kitchen.. 1 tab by mouth qam with food 3)  Benazepril Hcl 10 Mg Tabs (Benazepril hcl) .Marland Kitchen.. 1 tab by mouth daily  Patient Instructions: 1)  Will schedule your transvaginal u/s. 2)  Start on benazepril once daily for high BP. 3)  Return for f/u BP/ BMP in 6 wks. Prescriptions: BENAZEPRIL HCL 10 MG TABS (BENAZEPRIL HCL) 1 tab by mouth daily  #30 x 1   Entered and Authorized by:   Seymour Bars DO   Signed by:   Seymour Bars DO on 08/16/2009   Method used:   [facility] to        [facility] [phone]* (retail)       [address]       [place], [place]  [address]       Ph: [phone] or [phone]       Fax: [phone]   RxID:   [id number]

## 2010-06-27 NOTE — Progress Notes (Signed)
     New Problems: SCREENING FOR LIPOID DISORDERS (ICD-V77.91)   New Problems: SCREENING FOR LIPOID DISORDERS (ICD-V77.91)

## 2010-06-29 NOTE — Miscellaneous (Signed)
Summary: Order Confirmation for CPAP/[facility]  Order Confirmation for CPAP/[facility]   Imported By: Lanelle Bal 06/07/2010 10:23:15  _____________________________________________________________________  External Attachment:    Type:   Image     Comment:   External Document

## 2010-08-09 LAB — CBC
HCT: 38.2 % (ref 36.0–46.0)
HCT: 43.2 % (ref 36.0–46.0)
Hemoglobin: 14.7 g/dL (ref 12.0–15.0)
MCHC: 34.3 g/dL (ref 30.0–36.0)
MCV: 85.1 fL (ref 78.0–100.0)
MCV: 87.6 fL (ref 78.0–100.0)
Platelets: 196 10*3/uL (ref 150–400)
RBC: 4.93 MIL/uL (ref 3.87–5.11)
RDW: 12.6 % (ref 11.5–15.5)
WBC: 6.6 10*3/uL (ref 4.0–10.5)

## 2010-08-09 LAB — DIFFERENTIAL
Eosinophils Relative: 5 % (ref 0–5)
Lymphocytes Relative: 28 % (ref 12–46)
Lymphs Abs: 1.8 10*3/uL (ref 0.7–4.0)
Monocytes Relative: 7 % (ref 3–12)

## 2010-08-09 LAB — COMPREHENSIVE METABOLIC PANEL
ALT: 22 U/L (ref 0–35)
AST: 21 U/L (ref 0–37)
Alkaline Phosphatase: 57 U/L (ref 39–117)
BUN: 13 mg/dL (ref 6–23)
Chloride: 106 mEq/L (ref 96–112)
GFR calc Af Amer: >60 mL/min (ref >60)
Glucose, Bld: 98 mg/dL (ref 70–99)
Potassium: 4.7 mEq/L (ref 3.5–5.1)
Sodium: 139 mEq/L (ref 135–145)

## 2010-08-09 LAB — POCT CARDIAC MARKERS: Troponin i, poc: <0.05 ng/mL (ref 0.00–0.09)

## 2010-08-09 LAB — MAGNESIUM: Magnesium: 2.2 mg/dL (ref 1.5–2.5)

## 2010-08-09 LAB — LIPID PANEL
LDL Cholesterol: 131 mg/dL — ABNORMAL HIGH (ref 0–99)
Total CHOL/HDL Ratio: 4.4 RATIO
VLDL: 24 mg/dL (ref 0–40)

## 2010-08-09 LAB — BASIC METABOLIC PANEL
CO2: 29 mEq/L (ref 19–32)
Chloride: 105 mEq/L (ref 96–112)
Creatinine, Ser: 0.7 mg/dL (ref 0.4–1.2)
GFR calc non Af Amer: >60 mL/min (ref >60)
Glucose, Bld: 100 mg/dL — ABNORMAL HIGH (ref 70–99)
Sodium: 144 mEq/L (ref 135–145)

## 2010-08-09 LAB — PROTIME-INR
INR: 1.13 (ref 0.00–1.49)
Prothrombin Time: 14.7 seconds (ref 11.6–15.2)

## 2010-08-09 LAB — APTT: aPTT: 31 seconds (ref 24–37)

## 2010-08-11 LAB — CBC
HCT: 40.1 % (ref 36.0–46.0)
Hemoglobin: 13.8 g/dL (ref 12.0–15.0)
MCH: 30.4 pg (ref 26.0–34.0)
MCHC: 34.4 g/dL (ref 30.0–36.0)

## 2010-10-10 NOTE — Assessment & Plan Note (Signed)
NAMESARINA, Mariah Beck               ACCOUNT NO.:  [id number]   MEDICAL RECORD NO.:  [id number]          PATIENT TYPE:  POB   LOCATION:  [facility] at [place]         FACILITY:  [facility]   PHYSICIAN:  Elsie Lincoln, MD      DATE OF BIRTH:  1950-07-27   DATE OF SERVICE:  11/30/2009                                  CLINIC NOTE   The patient is a 60 year old female who presents for followup of  ultrasound.  The patient had a thickened endometrium on ultrasound which  she got for pelvic pain.  She has been going to physical therapy for  pelvic pain.  There is no other pathology on the ultrasound other than a  thickened endometrium.  The patient had been on Provera monthly and has  shrunk from 1.2 to 0.7 which is still thickened, although it seemed I  got into the uterine cavity up to 9 cm and 2 passes were obtained.  My  biopsy specimen showed rare glandular atrophic endometrium.  Given her  lining is still thick for postmenopausal female, I would like to go and  proceed with D and C hysteroscopy.  The patient agrees with this plan.  The risk of the procedure was explained for D and C hysteroscopy,  bleeding, infection, damage to the back of the uterus.   The patient also needs to be tested for BRCA1 and 2, she has elected not  to do this again today.  She understands that she would be a candidate  for oophorectomy and increased breast surveillance with MRIs.   PAST MEDICAL HISTORY:  Reviewed.  Arthritis, reflux, high blood  pressure.  She has self discontinued her ACE inhibitor.   PAST SURGICAL HISTORY:  C-section, appendectomy, ORIF of the left leg.   OBSTETRICAL HISTORY:  Menopausal.  No history of ovarian cyst, fibroid  tumors, sexually transmitted diseases, or abnormal Pap smear.  She has  had 1 C-section.   FAMILY HISTORY:  Breast and ovarian cancer in her mother and aunt.   SOCIAL HISTORY:  She does not smoke or drink alcohol.  She is employed.   MEDICATIONS:  Mobic and  Ambien.   ALLERGIES:  Hallucinations when she takes LUNESTA.  There is no latex  allergy.   PHYSICAL EXAMINATION:  VITAL SIGNS:  Pulse 61, blood pressure 144/88,  weight 218, height 66 inches.  We will do more physical exam on the day  off.   ASSESSMENT AND PLAN:  A 60 year old female with thickened endometrium  and poor biopsy specimen in office.  1. Dilation and curettage hysteroscopy.  2. Consent as above.  3. Breast cancer antigen 1 and 2 testing needs to be done.           ______________________________  Elsie Lincoln, MD     KL/MEDQ  D:  11/30/2009  T:  12/01/2009  Job:  [id number]

## 2010-10-10 NOTE — Assessment & Plan Note (Signed)
NAMEJILLIANNA, Mariah Beck NO.:  [id number]   MEDICAL RECORD NO.:  [id number]          PATIENT TYPE:  POB   LOCATION:  [facility] [place]         FACILITY:  [facility]   PHYSICIAN:  Elsie Lincoln, MD      DATE OF BIRTH:  1950/08/21   DATE OF SERVICE:  02/07/2010                                  CLINIC NOTE   The patient is a 60 year old female who presents for followup of her D  and C hysteroscopy.  The patient had had an ultrasound several months  ago that showed a slightly thickened endometrium of 1.2 cm.  She had had  some Provera that decreased the lining to 7 mm, but never had any  bleeding, is still slightly thickened for postmenopausal female.  Her  endometrial biopsy showed insufficient tissue for diagnosis, so we took  her to the operating room.  The hysteroscopy showed a slightly tunnel-  shaped, T-shaped uterus with a questionable thickened portion on the  anterior wall.  This was scraped and removed.  Also on bimanual and  speculum exam, there showed slight fusion of the posterior lip of the  cervix to the vaginal wall and there was a biopsy of this area to make  sure there was no pathology here.  When the hysteroscope was  reintroduced after the D and C,  this slightly thickened area was now  gone from the patient's uterus.  Given the patient had a very tunneled,  T-shaped uterus, I did query whether the patient had been exposed to  DES, she does not believe that, but she does not know for sure.  Her  mother had had a miscarriage in the past.  Given this, I do recommend  that she have yearly Pap smears.  We talked about the slight increased  risk of cervical cancer and vaginal cancer.  The patient wondered  whether we should just do a hysterectomy.  I told that we had done the  Gold standard of visualizing this uterus, scraping the uterus, and that  there does not appear to be any hyperplasia.  The pathology revealed  prominently benign endocervical and  squamous mucosa admixed with mucus.  There was rare strips of endometrium, but the patient did have very,  very thin endometrium on visualization, so I am not surprised.  I do not  feel that we need to do any more ultrasounding or biopsying.  The  patient never believed we will then go after the biopsy again, but  hopefully the patient will not have any more issue.  I do not think the  hysterectomy is warranted at this time and the patient concurs.  The  patient is due for yearly exam in approximately June or July next year.  Her mammogram is up to date.           ______________________________  Elsie Lincoln, MD     KL/MEDQ  D:  02/07/2010  T:  02/08/2010  Job:  [id number]

## 2010-10-10 NOTE — Assessment & Plan Note (Signed)
Mariah Beck, Mariah Beck               ACCOUNT NO.:  [id number]   MEDICAL RECORD NO.:  [id number]          PATIENT TYPE:  POB   LOCATION:  [facility] at [place]         FACILITY:  [facility]   PHYSICIAN:  Elsie Lincoln, MD      DATE OF BIRTH:  21-Jun-1950   DATE OF SERVICE:                                  CLINIC NOTE   The patient is 60 year old female who presents for test results and  biopsy of vestibule and right labia majora.  The patient is a 58-year-  old female who was referred to me by Dr. Cathey Endow for a thick endometrium  found on ultrasound.  The patient had left lower quadrant pain.  Her  ultrasound was negative for any masses or other uterine abnormality  other than this thick endometrium that was 12 mm.  Biopsy was performed  with 2 passes which showed mucus and scant glandular fragments.  The  glandular fragments were benign in the cervix and there was a few  fragments in the atrophic endometrium.  These were markedly limited and  insufficient for endometrial diagnosis.  I did take 2 passes and I did  reach the fundus on both passes.  I explained to the patient that I felt  like I got as much tissue as is available but the other option that we  could do is give her trial of Provera withdrawal bleeding.  If she  bleeds, then there was some proliferative endometrium in situ, so that  would not be normal for menopausal female, and we could scan her after  the Provera withdrawal bleed and come up with a plan thereafter.  The  patient also has marked pain and dyspareunia.  She has pain in her  pelvic diaphragm and ischial spines.  She is referred for physical  therapy but she has not called yet.  Also, she had this ulcer-like red  discoloring of her vestibule and I wanted to biopsy this.  There is on  her left side of her vestibule.  She also had a dark irregular bordered  nevi of her right labia majora that I wanted to biopsy as well.  The  patient was consented for both biopsies, 1%  lidocaine was injected in  both areas after each area was cleaned with Betadine.  A punch biopsy  was used to excise the area and they were sent in separate pathology  containers.  Hemostasis was achieved with direct pressure and a small  amount of silver nitrate.  The patient tolerated the procedure well.   ASSESSMENT/PLAN:  A 60 year old female with,  1. Thickened endometrium on ultrasound, insufficient biopsy, but all      glands in the biopsy were negative.  We will try Provera withdrawal      bleed.  2. Vulvar biopsies and vestibule biopsy as above.  3. Patient encouraged to go to physical therapy to help with pelvic      pain.           ______________________________  Elsie Lincoln, MD     KL/MEDQ  D:  09/06/2009  T:  09/07/2009  Job:  [id number]

## 2010-10-10 NOTE — Assessment & Plan Note (Signed)
NAMELEVETTE, PAULICK               ACCOUNT NO.:  [id number]   MEDICAL RECORD NO.:  [id number]          PATIENT TYPE:  POB   LOCATION:  [facility] at [place]         FACILITY:  [facility]   PHYSICIAN:  Elsie Lincoln, MD      DATE OF BIRTH:  [birth year]   DATE OF SERVICE:                                  CLINIC NOTE   HISTORY OF PRESENT ILLNESS:  The patient is a 60 year old female who  presents for followup of vulvar biopsy results and Provera withdrawal  bleed.  The patient was sent to me originally for pelvic pain.  Her  ultrasound showed normal ovaries and uterus except for a thickened  endometrium which was 12 mm which is abnormal in a postmenopausal  female.  Her endometrial biopsy was negative and very scant.  We decided  Provera withdrawal bleed which was positive for a small amount of  bleeding but significant for someone who used to be in menopause.  I  discussed with her that endometrial biopsies gets about 96% of lesions.  If there was endometrial hyperplasia, the Provera would be curative.  We  discussed going directly to D and C versus to continue with Provera  withdrawal bleeds for 3 months and repeating transvaginal ultrasound.  The patient agreed to continue with Provera withdrawal bleed.  We will  repeat ultrasound after the third Provera withdrawal bleed and see what  the endometrial thickness is.  If it is still thick, we will proceed  with D and C.  The patient has not gone for physical therapy of her  pelvis to see if this would help in her pelvic pain.  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  She was on Diflucan for 7 days.  She is still having some  internal itching  and a wet prep is performed today.  Overall, she  states her pelvic pain is better and she will do the pelvic physical  therapy when she has more time at work.   ASSESSMENT/PLAN:  A 60 year old female with:  1. Thickened endometrium.  Using  Provera withdrawal bleeds for      followup ultrasound in June.  2. Chronic vulvitis with yeast.  Follow up wet prep today.  3. Chronic pelvic pain needing physical therapy.           ______________________________  Elsie Lincoln, MD     KL/MEDQ  D:  09/20/2009  T:  09/21/2009  Job:  [id number]

## 2010-10-10 NOTE — Assessment & Plan Note (Signed)
[name], Beck               ACCOUNT NO.:  [id number]   MEDICAL RECORD NO.:  [id number]          PATIENT TYPE:  POB   LOCATION:  [facility] at [place]         FACILITY:  [facility]   PHYSICIAN:  Elsie Lincoln, MD      DATE OF BIRTH:  June 10, 1950   DATE OF SERVICE:  08/30/2009                                  CLINIC NOTE   The patient is a 60 year old female who was referred to me from Dr.  Lurene Shadow for a thick endometrium found on ultrasound.  The patient is a 76-  year-old female who began having bilateral lower quadrant pain that  radiated on to her legs in February.  She saw Dr. Lurene Shadow in mid  February.  She ordered a CT of abdomen and then a transvaginal  ultrasound.  A transvaginal ultrasound showed a normal-size uterus  measuring 8.3 x 5.2 x 3.7 with no fibroids.  However, the endometrial  stripe was 12 mm which is abnormal for a postmenopausal patient.  The  ovaries were both seen.  The right ovary measured 1.7 x 1.2 x 1.6.  Left  ovary measured 1.6 x 1.2 x 1.3.  There was no adnexal mass or free  pelvic fluid.  There were no calcifications.  The patient has been in  menopause for 5-6 years.  She never had any postmenopausal bleeding.  She did have bleeding after the ultrasound, but after speaking with the  patient it was most likely due to trauma from the ultrasound, she said  the ultrasound was very painful.  The patient also is not sexually  active very often because of extreme dyspareunia, insertional and  throughout the sexual encounter.  This has been going on for about 5  years.  The patient did undergo some investigation by urologist and a  gynecologist 5 years ago for the dyspareunia but there never was cure or  an etiology.  She never did have cystoscopy by the urologist to find out  if it was her bladder.  She did have some Vagifem and Estring for  postmenopausal atrophic vaginitis, but this did not help her  dyspareunia.  Her pain in her lower quadrant that brought her  to Dr.  Lurene Shadow recently is now settled in her left lower quadrant.  It happens  about 6 times a day and it almost takes her breath away.  Dr. Lurene Shadow  thought it was constipation and she has got her more regular bowel  movements, but she says the pain is still there.   PAST MEDICAL HISTORY:  Arthritis, reflux, high blood pressure.   PAST SURGICAL HISTORY:  C-section, appendectomy, ORIF of the right leg.   OBSTETRICAL HISTORY:  C-section x1, menopausal.  No history of ovarian  cyst, fibroid tumors, sexually transmitted diseases.  No history of  abnormal Pap smears.   FAMILY HISTORY:  Mother and all of her mother's sisters died of breast  and ovarian cancer.  The patient would like to be tested for the BRCA  gene at some point.   SOCIAL HISTORY:  The patient is employed.  She works with [employer].  She lives with her husband.  She does not smoke, drink alcohol or do  drugs.   REVIEW OF SYSTEMS:  Systemic review is positive for abdominal pain and  muscle and joint pain.   MEDICATIONS:  Benazepril, Mobic, Ambien.   ALLERGIES:  LUNESTA.   Physical exam; pulse 55, blood pressure 137/80, weight 219, height 5  feet 6 inches.  General, well nourished, well developed, no apparent  stress.  HEENT, normocephalic, atraumatic.  Chest, normal movement.  Abdomen; soft, nontender.  No organomegaly.  No hernia.  No rebound, no  guarding.  Genitalia, Tanner V.  On a wet Q-tip test, the patient has  some clitoral pain.  She also has pain on the left vestibule.  There is  also a small ulcer on the left vestibule.  There is also a pigmented  area on the right labia majora.  I discussed the biopsies areas and the  patient will return in next week for biopsy of these areas.  Vagina is  atrophic and bleeds with insertion of a speculum on the cervix due to  atrophy.  Bimanual shows the bladder is nontender.  The patient has pain  over the ischial spines and levators.  The patient is nontender over the   uterus.  The patient is nontender over the adnexa.   DESCRIPTION OF PROCEDURE:  After informed consent was obtained for the  endometrial biopsy, the patient was placed in dorsal lithotomy position.  The cervix was cleaned with Betadine.  The uterus sounded to 9 cm.  The  2 passes were obtained with an endometrial Pipelle.  A small amount of  tissue was obtained.   ASSESSMENT AND PLAN:  A 60 year old female with left lower quadrant pain  of an unknown etiology and thickened endometrium found on transvaginal  ultrasound.  1. Endometrial biopsy.  2. Pain is more consistent with muscles in the pelvic diaphragm.  The      patient referred for pelvic physical therapy.  3. The patient seems to have pain at the introitus.  We need to do      biopsies of the area on the left vestibule and possibly the right      pigmented area on the labia.  4. Ovaries and uterus looked normal.  5. Return to clinic for second biopsy and results.           ______________________________  Elsie Lincoln, MD     KL/MEDQ  D:  08/30/2009  T:  08/31/2009  Job:  [id number]

## 2010-10-18 ENCOUNTER — Other Ambulatory Visit: Payer: Self-pay | Admitting: Family Medicine

## 2010-10-18 DIAGNOSIS — E785 Hyperlipidemia, unspecified: Secondary | ICD-10-CM

## 2010-10-18 MED ORDER — PRAVASTATIN SODIUM 40 MG PO TABS: 40.0000 mg | ORAL_TABLET | Freq: Every day | ORAL | Status: DC

## 2010-10-18 NOTE — Telephone Encounter (Signed)
Pt requesting refill of her pravastatin 40 mg PO daily.  Pharmacy has been faxing [place] regarding this medication. Plan:  # 30/ 1 refill sent to [facility]. [name], LPN Domingo Dimes

## 2010-10-25 ENCOUNTER — Other Ambulatory Visit: Payer: Self-pay | Admitting: Family Medicine

## 2010-10-25 MED ORDER — ZOLPIDEM TARTRATE 10 MG PO TABS: 10.0000 mg | ORAL_TABLET | Freq: Every evening | ORAL | Status: DC | PRN

## 2010-10-25 NOTE — Telephone Encounter (Signed)
RX printed.  Can fax tomorrow.

## 2010-10-25 NOTE — Telephone Encounter (Signed)
Pt going out of town and is asking to get her ambien 10 mg  refilled before leaving.  Last refill was on 08-01-10 ( 1 week early for request).  Can we do this?? Please advise. Plan:  Routed to Dr. Arlice Colt, LPN [facility]

## 2010-10-25 NOTE — Telephone Encounter (Signed)
LMOM at pt work number that her [place] script will be faxed tomorrow. [name], LPN [facility]

## 2010-12-11 ENCOUNTER — Other Ambulatory Visit: Payer: Self-pay | Admitting: Family Medicine

## 2010-12-11 MED ORDER — CITALOPRAM HYDROBROMIDE 20 MG PO TABS: 20.0000 mg | ORAL_TABLET | Freq: Every day | ORAL | Status: DC

## 2010-12-11 NOTE — Telephone Encounter (Signed)
Okay for 30 day supply the patient needs to go ahead and make an appointment with Dr. Cathey Endow.

## 2010-12-11 NOTE — Telephone Encounter (Signed)
Pt called and spoke with the front office staff.  She told them them there was multiple attempts to try to get her celexa refilled since she was out of medication.  According to Victorino Dike she was upset.   Plan:  Immediately called the cell # pt left to contact her at, but had to [facility]  The pt chart was reviewed and the last depression office visit pt has satisfied is 03-08-10, and at that office visit the provider wanted pt to return in 6 weeks and it appears that pt never followed for that visit.  Also,   Pt seen on 03-27-10 for hosp fup for chest pain and told to fup back up in 2 mths for labs and BP.  Labs appear to be done on 05-05-10, but I don't see where the BP was followed up.  Please advise if we can send 30 day supply of the Celexa 20 mg and have pt schedule office visit in the next 30 days for future refills of her medication. [name], LPN [facility]

## 2010-12-11 NOTE — Telephone Encounter (Signed)
Pt notified that she needs to schedule an office visit.  30 day supply sent in the interim for the pt. To her pharm.   [name], LPN [facility] Dimes

## 2010-12-12 ENCOUNTER — Other Ambulatory Visit: Payer: Self-pay | Admitting: Family Medicine

## 2010-12-17 ENCOUNTER — Encounter: Payer: Self-pay | Admitting: Family Medicine

## 2010-12-18 ENCOUNTER — Other Ambulatory Visit: Payer: Self-pay | Admitting: Family Medicine

## 2010-12-18 DIAGNOSIS — Z1231 Encounter for screening mammogram for malignant neoplasm of breast: Secondary | ICD-10-CM

## 2010-12-19 ENCOUNTER — Ambulatory Visit
Admission: RE | Admit: 2010-12-19 | Discharge: 2010-12-19 | Disposition: A | Discharge status: Home / Self Care | Payer: Self-pay | Source: Ambulatory Visit | Attending: Family Medicine | Admitting: Family Medicine

## 2010-12-19 ENCOUNTER — Encounter: Payer: Self-pay | Admitting: Family Medicine

## 2010-12-19 ENCOUNTER — Ambulatory Visit (INDEPENDENT_AMBULATORY_CARE_PROVIDER_SITE_OTHER): Payer: Managed Care, Other (non HMO) | Admitting: Family Medicine

## 2010-12-19 DIAGNOSIS — E039 Hypothyroidism, unspecified: Secondary | ICD-10-CM

## 2010-12-19 DIAGNOSIS — Z1231 Encounter for screening mammogram for malignant neoplasm of breast: Secondary | ICD-10-CM

## 2010-12-19 DIAGNOSIS — E785 Hyperlipidemia, unspecified: Secondary | ICD-10-CM

## 2010-12-19 DIAGNOSIS — F329 Major depressive disorder, single episode, unspecified: Secondary | ICD-10-CM

## 2010-12-19 DIAGNOSIS — R635 Abnormal weight gain: Secondary | ICD-10-CM

## 2010-12-19 DIAGNOSIS — F3289 Other specified depressive episodes: Secondary | ICD-10-CM

## 2010-12-19 DIAGNOSIS — I10 Essential (primary) hypertension: Secondary | ICD-10-CM

## 2010-12-19 MED ORDER — CITALOPRAM HYDROBROMIDE 20 MG PO TABS: 20.0000 mg | ORAL_TABLET | Freq: Every day | ORAL | Status: DC

## 2010-12-19 NOTE — Progress Notes (Signed)
  Subjective:    Patient ID: Mariah Beck, female    DOB: 02-05-1951, 60 y.o.   MRN: [id number]  HPI 60 yo WF presents for f/u visit with a  CMP and med RFs.  She is running out of her citalopram and is happy on the current dose.  She is not motivated to make any changes to her diet or exercise at this time.    BP 130/71  Pulse 63  Ht 5\' 6"  (1.676 m)  Wt 228 lb (103.42 kg)  BMI 36.80 kg/m2  SpO2 96%   Review of Systems  Constitutional: Negative for fatigue.  Respiratory: Negative for shortness of breath.   Cardiovascular: Negative for chest pain, palpitations and leg swelling.  Psychiatric/Behavioral: Negative for suicidal ideas, sleep disturbance and dysphoric mood. The patient is not nervous/anxious.        Objective:   Physical Exam  Constitutional: She appears well-developed and well-nourished.  HENT:  Mouth/Throat: Oropharynx is clear and moist.  Eyes: Conjunctivae are normal.  Neck: Neck supple. No thyromegaly present.  Cardiovascular: Normal rate, regular rhythm and normal heart sounds.   No murmur heard. Pulmonary/Chest: Effort normal and breath sounds normal. No respiratory distress.  Musculoskeletal: She exhibits no edema.  Skin: Skin is warm and dry.  Psychiatric: She has a normal mood and affect.          Assessment & Plan:

## 2010-12-19 NOTE — Assessment & Plan Note (Signed)
Not on meds.  Will check TSH today.

## 2010-12-19 NOTE — Patient Instructions (Signed)
Update labs today.  RF citalopram.  Will call you w/ results tomorrow.  REturn for f/u in 6 mos.

## 2010-12-19 NOTE — Assessment & Plan Note (Signed)
Doing well on citalopram.  RFd.

## 2010-12-19 NOTE — Assessment & Plan Note (Signed)
BP at goal on [place].  Due for CMP today.  Will update.  Has RFs on meds.  Needs to work on diet and exercise but does not seem motivated at this point.

## 2010-12-20 ENCOUNTER — Telehealth: Payer: Self-pay | Admitting: Family Medicine

## 2010-12-20 LAB — COMPLETE METABOLIC PANEL WITH GFR
AST: 17 U/L (ref 0–37)
Alkaline Phosphatase: 48 U/L (ref 39–117)
GFR, Est Non African American: >60 mL/min (ref >60)
Glucose, Bld: 90 mg/dL (ref 70–99)
Sodium: 143 mEq/L (ref 135–145)
Total Bilirubin: 0.6 mg/dL (ref 0.3–1.2)
Total Protein: 6.4 g/dL (ref 6.0–8.3)

## 2010-12-20 NOTE — Telephone Encounter (Signed)
Pt aware of the above  

## 2010-12-20 NOTE — Telephone Encounter (Signed)
Pls let pt know that her chemistry panel and thyroid function look perfect.

## 2010-12-26 ENCOUNTER — Other Ambulatory Visit: Payer: Self-pay | Admitting: *Deleted

## 2010-12-26 MED ORDER — MELOXICAM 15 MG PO TABS: 15.0000 mg | ORAL_TABLET | Freq: Every day | ORAL | Status: DC

## 2011-01-09 ENCOUNTER — Other Ambulatory Visit: Payer: Self-pay | Admitting: Family Medicine

## 2011-01-30 ENCOUNTER — Other Ambulatory Visit: Payer: Self-pay | Admitting: *Deleted

## 2011-01-30 MED ORDER — AMLODIPINE-OLMESARTAN 5-20 MG PO TABS: 1.0000 | ORAL_TABLET | Freq: Every day | ORAL | Status: DC

## 2011-02-16 ENCOUNTER — Other Ambulatory Visit: Payer: Self-pay | Admitting: *Deleted

## 2011-02-16 MED ORDER — CITALOPRAM HYDROBROMIDE 20 MG PO TABS: 20.0000 mg | ORAL_TABLET | Freq: Every day | ORAL | Status: DC

## 2011-04-05 DIAGNOSIS — H538 Other visual disturbances: Secondary | ICD-10-CM | POA: Insufficient documentation

## 2011-04-05 DIAGNOSIS — H2510 Age-related nuclear cataract, unspecified eye: Secondary | ICD-10-CM | POA: Insufficient documentation

## 2011-04-05 DIAGNOSIS — H40009 Preglaucoma, unspecified, unspecified eye: Secondary | ICD-10-CM | POA: Insufficient documentation

## 2011-04-30 ENCOUNTER — Telehealth: Payer: Self-pay | Admitting: *Deleted

## 2011-04-30 DIAGNOSIS — R739 Hyperglycemia, unspecified: Secondary | ICD-10-CM

## 2011-04-30 DIAGNOSIS — E785 Hyperlipidemia, unspecified: Secondary | ICD-10-CM

## 2011-05-01 LAB — BASIC METABOLIC PANEL
BUN: 19 mg/dL (ref 6–23)
Chloride: 105 mEq/L (ref 96–112)
Glucose, Bld: 92 mg/dL (ref 70–99)
Potassium: 4.4 mEq/L (ref 3.5–5.3)
Sodium: 139 mEq/L (ref 135–145)

## 2011-05-01 LAB — LIPID PANEL: Cholesterol: 128 mg/dL (ref 0–200)

## 2011-05-04 ENCOUNTER — Ambulatory Visit (INDEPENDENT_AMBULATORY_CARE_PROVIDER_SITE_OTHER): Payer: Managed Care, Other (non HMO) | Admitting: Family Medicine

## 2011-05-04 VITALS — BP 118/66 | HR 73 | Wt 202.0 lb

## 2011-05-04 DIAGNOSIS — E785 Hyperlipidemia, unspecified: Secondary | ICD-10-CM

## 2011-05-04 DIAGNOSIS — I10 Essential (primary) hypertension: Secondary | ICD-10-CM

## 2011-05-04 MED ORDER — LISINOPRIL 20 MG PO TABS: 20.0000 mg | ORAL_TABLET | Freq: Every day | ORAL | Status: DC

## 2011-05-04 MED ORDER — ZOLPIDEM TARTRATE 10 MG PO TABS: 10.0000 mg | ORAL_TABLET | Freq: Every evening | ORAL | Status: DC | PRN

## 2011-05-04 NOTE — Patient Instructions (Addendum)
Recheck BP in one month after switch to the lisinopril.  Recheck cholesterol on lower dose in 2 months. Request lipid panel.

## 2011-05-04 NOTE — Progress Notes (Signed)
  Subjective:    Patient ID: Mariah Beck, female    DOB: 1950-05-31, 60 y.o.   MRN: [id number]  HPI Her goal is to get off chol aad BP meds. She had lost about 30 lbs.  She wants to go over her labwork and has a from to complete for work regarding her labs.  No CP or SOB. Very consistant with her meds.    Review of Systems     Objective:   Physical Exam  Constitutional: She is oriented to person, place, and time. She appears well-developed and well-nourished.  Cardiovascular: Normal rate, regular rhythm and normal heart sounds.   Pulmonary/Chest: Effort normal and breath sounds normal.  Neurological: She is alert and oriented to person, place, and time.  Skin: Skin is warm and dry.  Psychiatric: She has a normal mood and affect. Her behavior is normal.          Assessment & Plan:  HTN- Will stop amlodipine and change to lisinopril instead of ARB for cost reasons. Recheck BP in one month after changes BP meds. Keep up the good work. She has lost 30 lbs.   Hyperlipidemia - Discussed will dec her pravastatin to 20mg  daily and recheck level in 2 months to see if might be able to get off the med.

## 2011-06-14 ENCOUNTER — Ambulatory Visit (INDEPENDENT_AMBULATORY_CARE_PROVIDER_SITE_OTHER): Payer: Managed Care, Other (non HMO) | Admitting: Family Medicine

## 2011-06-14 VITALS — BP 129/78 | HR 70 | Wt 190.0 lb

## 2011-06-14 DIAGNOSIS — I10 Essential (primary) hypertension: Secondary | ICD-10-CM

## 2011-06-14 MED ORDER — LISINOPRIL 20 MG PO TABS: 20.0000 mg | ORAL_TABLET | Freq: Every day | ORAL | Status: DC

## 2011-06-14 NOTE — Progress Notes (Signed)
  Subjective:    Patient ID: Mariah Beck, female    DOB: [birth year], 61 y.o.   MRN: [id number] Nurse BP check. Needs refill on Lisinopril. Pt has no c/o chest pain, blurred vision or headache HPI    Review of Systems     Objective:   Physical Exam        Assessment & Plan:  HTN - BP well controlled will send over refill.

## 2011-07-04 ENCOUNTER — Telehealth: Payer: Self-pay | Admitting: *Deleted

## 2011-07-04 MED ORDER — LISINOPRIL 10 MG PO TABS: 10.0000 mg | ORAL_TABLET | Freq: Every day | ORAL | Status: DC

## 2011-07-04 NOTE — Telephone Encounter (Signed)
Pt states that she was told at visit to come back in 1 month for BP check, which she did, and states that you and her discussed about whether or not she could stop BP meds. Pt would like to know if she can stop BP meds or continue them or does she need to f/u with you again. Please advise. If continuing BP meds she will need a refill.

## 2011-07-04 NOTE — Telephone Encounter (Signed)
Lets drop her to th e10mg  dose and then recheck BP in 3-4 weeks on the lower dose.  Can send her a new rx for lisinopril 10mg  QD.

## 2011-07-04 NOTE — Telephone Encounter (Signed)
Pt notified and med sent to pharmacy

## 2011-07-25 ENCOUNTER — Other Ambulatory Visit: Payer: Self-pay | Admitting: Family Medicine

## 2011-08-29 ENCOUNTER — Other Ambulatory Visit: Payer: Self-pay | Admitting: *Deleted

## 2011-08-29 MED ORDER — MELOXICAM 15 MG PO TABS: 15.0000 mg | ORAL_TABLET | Freq: Every day | ORAL | Status: DC

## 2011-09-20 ENCOUNTER — Ambulatory Visit (INDEPENDENT_AMBULATORY_CARE_PROVIDER_SITE_OTHER): Payer: Managed Care, Other (non HMO) | Admitting: Family Medicine

## 2011-09-20 ENCOUNTER — Ambulatory Visit
Admission: RE | Admit: 2011-09-20 | Discharge: 2011-09-20 | Disposition: A | Discharge status: Home / Self Care | Payer: Managed Care, Other (non HMO) | Source: Ambulatory Visit | Attending: Family Medicine | Admitting: Family Medicine

## 2011-09-20 ENCOUNTER — Encounter: Payer: Self-pay | Admitting: Family Medicine

## 2011-09-20 VITALS — BP 142/77 | HR 61 | Ht 66.0 in | Wt 170.0 lb

## 2011-09-20 DIAGNOSIS — R51 Headache: Secondary | ICD-10-CM

## 2011-09-20 DIAGNOSIS — R29810 Facial weakness: Secondary | ICD-10-CM

## 2011-09-20 DIAGNOSIS — Z13220 Encounter for screening for lipoid disorders: Secondary | ICD-10-CM

## 2011-09-20 MED ORDER — LOSARTAN POTASSIUM 50 MG PO TABS: 50.0000 mg | ORAL_TABLET | Freq: Every day | ORAL | Status: DC

## 2011-09-20 MED ORDER — PREDNISONE 10 MG PO TABS: ORAL_TABLET | ORAL | Status: DC

## 2011-09-20 NOTE — Patient Instructions (Signed)
Stop the mobic and the naprosyn while on the steroid.   Avoid caffeine We will call you with your lab results. If you don't here from [place] in about a week then please give [place] a call at [phone].

## 2011-09-20 NOTE — Progress Notes (Signed)
  Subjective:    Patient ID: Mariah Beck, female    DOB: Jul 10, 1950, 61 y.o.   MRN: [id number]  HPI Started having HA abourt a month ago. Thought was from lifting free-weights so stopped and HA has not been better. Says HA is constant. Occ will fel dizzy when moves her eyes.  Times HA will be severe and then will get a "hotflash".  Started using OTC naprosyn and volteran cream on her neck.  - Didn't helpl. Was taking meloxicam as well.  Had a couple of episodes at night when saw flashing white lights.  She is hypothryoid, hx of HTN. No fever.  No blurry vision HA is moslty at the back of the skull. Occ pressure behind her eyes.  Taoday on the top of the head.  No speech changes. Tried massage - no relief.    Review of Systems     Objective:   Physical Exam  Constitutional: She is oriented to person, place, and time. She appears well-developed and well-nourished.  HENT:  Head: Normocephalic and atraumatic.  Right Ear: External ear normal.  Left Ear: External ear normal.  Nose: Nose normal.  Mouth/Throat: Oropharynx is clear and moist.       TMs and canals are clear.   Eyes: Conjunctivae and EOM are normal. Pupils are equal, round, and reactive to light.  Neck: Neck supple. No thyromegaly present.  Cardiovascular: Normal rate, regular rhythm and normal heart sounds.   Pulmonary/Chest: Effort normal and breath sounds normal. She has no wheezes.  Lymphadenopathy:    She has no cervical adenopathy.  Neurological: She is alert and oriented to person, place, and time. She has normal strength and normal reflexes. She displays no atrophy, no tremor and normal reflexes. A cranial nerve deficit is present. She exhibits normal muscle tone. She displays a negative Romberg sign. She displays no seizure activity. Coordination normal.       She does have some facial asymmetry. When she smiles there is facial droop on the right cheek and mouth. Forehead is symmetric.    Skin: Skin is warm and dry.    Psychiatric: She has a normal mood and affect.          Assessment & Plan:  HA- I am concnered about possible stroke vs brain tumor vs migraine like variant.  (no prior hx of migraines).  Will check CBC, CMP, TSH, sed rate, RPR, vit B12, magnesium.  Also given coarse of steroid and stop OTC pain meds and mobic as she may also be experiencing rebound phenomenon with NSAIDs.  Okay to continue her topical Voltaren gel. Her her blood pressure may also be contributing to her headaches. She does have a prior history of high blood pressure. She has lost approximately 20 pounds in the last 3-4 months intentionally. We will start losartan 50 mg daily. She will pick it up today. Follow up in one week.  Facial Droop - I am concnered about possible stroke vs brain tumor vs migraine like variant.  (no prior hx of migraines).  Will order CT of head to eval for stroke. If neg and facial dropp persists and HA Persists then will get MRI of brain for further evlauation.

## 2011-09-21 ENCOUNTER — Other Ambulatory Visit: Payer: Self-pay | Admitting: *Deleted

## 2011-09-21 ENCOUNTER — Telehealth: Payer: Self-pay | Admitting: *Deleted

## 2011-09-21 LAB — LIPID PANEL
LDL Cholesterol: 150 mg/dL — ABNORMAL HIGH (ref 0–99)
VLDL: 11 mg/dL (ref 0–40)

## 2011-09-21 LAB — COMPLETE METABOLIC PANEL WITH GFR
BUN: 19 mg/dL (ref 6–23)
CO2: 30 mEq/L (ref 19–32)
Calcium: 10.4 mg/dL (ref 8.4–10.5)
Chloride: 106 mEq/L (ref 96–112)
Creat: 0.66 mg/dL (ref 0.50–1.10)
GFR, Est African American: >89 mL/min
GFR, Est Non African American: >89 mL/min

## 2011-09-21 LAB — CBC WITH DIFFERENTIAL/PLATELET
Basophils Absolute: 0 10*3/uL (ref 0.0–0.1)
Eosinophils Relative: 6 % — ABNORMAL HIGH (ref 0–5)
HCT: 40 % (ref 36.0–46.0)
Hemoglobin: 13.5 g/dL (ref 12.0–15.0)
Lymphocytes Relative: 26 % (ref 12–46)
MCHC: 33.8 g/dL (ref 30.0–36.0)
MCV: 86.8 fL (ref 78.0–100.0)
Monocytes Absolute: 0.3 10*3/uL (ref 0.1–1.0)
Monocytes Relative: 5 % (ref 3–12)
RDW: 12.7 % (ref 11.5–15.5)
WBC: 5.5 10*3/uL (ref 4.0–10.5)

## 2011-09-21 LAB — SEDIMENTATION RATE: Sed Rate: 4 mm/hr (ref 0–22)

## 2011-09-21 LAB — MAGNESIUM: Magnesium: 2.2 mg/dL (ref 1.5–2.5)

## 2011-09-21 MED ORDER — CYCLOBENZAPRINE HCL 10 MG PO TABS: 10.0000 mg | ORAL_TABLET | Freq: Every day | ORAL | Status: DC

## 2011-09-21 MED ORDER — CYCLOBENZAPRINE HCL 10 MG PO TABS: 10.0000 mg | ORAL_TABLET | Freq: Every evening | ORAL | Status: AC | PRN

## 2011-09-21 NOTE — Telephone Encounter (Signed)
LMOM for pt to return call. 

## 2011-09-21 NOTE — Telephone Encounter (Signed)
Pt states that she would like to know if you would send an rx for some muscle relaxer. States she has taken Flexeril before. Please advise.

## 2011-09-21 NOTE — Telephone Encounter (Signed)
rx sent. I would like to send her to neuro is she is ok with that.

## 2011-09-24 NOTE — Telephone Encounter (Signed)
LMOM

## 2011-09-24 NOTE — Telephone Encounter (Signed)
Pt states that she has an appt with you tomorrow and wants to discuss the reasoning for the neurologist and states she will let you know then if she will go or not.

## 2011-09-25 ENCOUNTER — Encounter: Payer: Self-pay | Admitting: Family Medicine

## 2011-09-25 ENCOUNTER — Ambulatory Visit (INDEPENDENT_AMBULATORY_CARE_PROVIDER_SITE_OTHER): Payer: Managed Care, Other (non HMO) | Admitting: Family Medicine

## 2011-09-25 VITALS — BP 121/68 | HR 60 | Ht 66.0 in | Wt 170.0 lb

## 2011-09-25 DIAGNOSIS — R29810 Facial weakness: Secondary | ICD-10-CM

## 2011-09-25 DIAGNOSIS — R519 Headache, unspecified: Secondary | ICD-10-CM

## 2011-09-25 DIAGNOSIS — R51 Headache: Secondary | ICD-10-CM

## 2011-09-25 DIAGNOSIS — M542 Cervicalgia: Secondary | ICD-10-CM

## 2011-09-25 DIAGNOSIS — E785 Hyperlipidemia, unspecified: Secondary | ICD-10-CM

## 2011-09-25 NOTE — Progress Notes (Signed)
  Subjective:    Patient ID: Mariah Beck, female    DOB: 12-20-1950, 61 y.o.   MRN: [id number]  HPI Here to followup headaches and dizziness, and facial droop. HA are much better. She says overall she feels much better. She's not had any more dizzy episodes. Says still has al ot of pressure at the base of the skull. Says thinks the prednisone helped. She also stopped all the over-the-counter anti-inflammatories.  Says the neck pain feels like a pulling sensation.  Thinks could have pinched nerve.  She denies any numbness into the arms or hands. We had ordered a CT when I saw her last time because she seemed to have some facial droop on the right. We did perform a CT and it was negative for anything acute. There is also no sign of mass effect. 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 She is not sure really she has facial droop.  Her husand says he feels he hasn't noticed any difference. She did restart the losartan. She is taking 1/2 tab of flexeril at bedtime - feels it does help her neck pain.     Review of Systems     Objective:   Physical Exam  Constitutional: She is oriented to person, place, and time. She appears well-developed and well-nourished.  Musculoskeletal:       Neck with normal flexion, Dec extension, rotation right and left and side bending  Neurological: She is alert and oriented to person, place, and time.  Skin: Skin is warm and dry.  Psychiatric: She has a normal mood and affect. Her behavior is normal. Judgment and thought content normal.          Assessment & Plan:  HA- much improved. In fact they seem to have resolved. She is still left with some neck pain. I think she could have just been having rebound headaches from all the over-the-counter medications.  Neck Pain - Dec ROM on exam today. I do think she would benefit from physical therapy for her neck. We could consider cervical x-rays and further imaging if she does not improve. It certainly is  possible that she may have a disc herniation that may be causing some of her pain and discomfort, and may have even been triggering some of her headaches.  Facial droop-unclear if this is new or old. She does have an appointment with neurology in about 2 weeks. Certainly if she is not significantly improving or if it persists then I would like her to see the neurologist.  Hyperlipidemia - her cholesterol was quite elevated on the repeat lab work. She says she was passing. In fact it looked much better last year.  After further discussion she wants to Hold on statin and recheck in one month.  She feels the lab work is inaccurate.

## 2011-10-29 ENCOUNTER — Other Ambulatory Visit: Payer: Self-pay | Admitting: Family Medicine

## 2011-11-27 ENCOUNTER — Other Ambulatory Visit: Payer: Self-pay | Admitting: Family Medicine

## 2011-11-29 ENCOUNTER — Other Ambulatory Visit: Payer: Self-pay | Admitting: Family Medicine

## 2011-12-05 DIAGNOSIS — R519 Headache, unspecified: Secondary | ICD-10-CM | POA: Insufficient documentation

## 2011-12-25 ENCOUNTER — Other Ambulatory Visit: Payer: Self-pay | Admitting: *Deleted

## 2011-12-25 MED ORDER — LOSARTAN POTASSIUM 50 MG PO TABS: 50.0000 mg | ORAL_TABLET | Freq: Every day | ORAL | Status: DC

## 2012-02-06 ENCOUNTER — Ambulatory Visit (INDEPENDENT_AMBULATORY_CARE_PROVIDER_SITE_OTHER): Payer: Managed Care, Other (non HMO) | Admitting: Family Medicine

## 2012-02-06 ENCOUNTER — Encounter: Payer: Self-pay | Admitting: Family Medicine

## 2012-02-06 VITALS — BP 138/74 | HR 103 | Wt 172.0 lb

## 2012-02-06 DIAGNOSIS — E785 Hyperlipidemia, unspecified: Secondary | ICD-10-CM

## 2012-02-06 DIAGNOSIS — I10 Essential (primary) hypertension: Secondary | ICD-10-CM

## 2012-02-06 NOTE — Progress Notes (Signed)
  Subjective:    Patient ID: Mariah Beck, female    DOB: [birth year], 61 y.o.   MRN: [id number]  HPI On the statin in December and bloodwork was normal. She really worke don diet and exercise and weaned off the statin. She has lost 70 lb in a year workin gon diet and exercise.  We have repeated her cholesterol back in April but her LDL was very high. We felt it may have been an abnormality that she that she had been passing. We decided to repeat one month. She forgot and never followed back up for this. She recently did a health screening in the local community. They did a fingerstick cholesterol where she was not fasting. Her total cholesterol was 216, HDL 60, LDL is 131 and triglycerides were 127. Her glucose was 110 and her blood pressure was 127/76. She is worried about her numbers because she felt that they would be normal.  HTN- No CP or SOB.  Taking her meds.     Review of Systems     Objective:   Physical Exam  Constitutional: She is oriented to person, place, and time. She appears well-developed and well-nourished.  HENT:  Head: Normocephalic and atraumatic.  Cardiovascular: Normal rate, regular rhythm and normal heart sounds.   Pulmonary/Chest: Effort normal and breath sounds normal.  Neurological: She is alert and oriented to person, place, and time.  Skin: Skin is warm and dry.  Psychiatric: She has a normal mood and affect. Her behavior is normal.          Assessment & Plan:  Hyperlipidemia - Will recheck since she was not fasting. I explained her that not fasting definitely affects the numbers. I think the best option at this point would be to recheck it while she is fasting. She was given a lab slip today. If at that point it looks great and we will stay off the statin. If we do need to restart a statin and I recommend pravastatin 20 mg.  HTN- well controlled.  Continue current regimen. Unfortunately his blood pressure is not low enough to discontinue her medication.  Followup in 6 months.  She says she will get a flu shot at work.

## 2012-02-06 NOTE — Patient Instructions (Addendum)
We will call you with your lab results. If you don't here from us in about a week then please give us a call at [phone].  

## 2012-02-08 LAB — LIPID PANEL: Cholesterol: 219 mg/dL — ABNORMAL HIGH (ref 0–200)

## 2012-02-08 LAB — COMPLETE METABOLIC PANEL WITH GFR
Albumin: 4.4 g/dL (ref 3.5–5.2)
BUN: 18 mg/dL (ref 6–23)
CO2: 27 mEq/L (ref 19–32)
Calcium: 9.9 mg/dL (ref 8.4–10.5)
Chloride: 106 mEq/L (ref 96–112)
GFR, Est Non African American: 70 mL/min
Glucose, Bld: 70 mg/dL (ref 70–99)
Potassium: 4.2 mEq/L (ref 3.5–5.3)
Total Protein: 6.3 g/dL (ref 6.0–8.3)

## 2012-02-13 ENCOUNTER — Telehealth: Payer: Self-pay | Admitting: *Deleted

## 2012-02-13 MED ORDER — SIMVASTATIN 40 MG PO TABS: 40.0000 mg | ORAL_TABLET | Freq: Every evening | ORAL | Status: DC

## 2012-02-13 NOTE — Telephone Encounter (Signed)
Pt states you can send the cholesterol medication.

## 2012-02-13 NOTE — Telephone Encounter (Signed)
rx sent to [facility].

## 2012-02-20 ENCOUNTER — Ambulatory Visit (INDEPENDENT_AMBULATORY_CARE_PROVIDER_SITE_OTHER): Payer: Managed Care, Other (non HMO) | Admitting: Family Medicine

## 2012-02-20 ENCOUNTER — Encounter: Payer: Self-pay | Admitting: Family Medicine

## 2012-02-20 VITALS — BP 144/82 | HR 75 | Temp 98.0°F | Wt 174.0 lb

## 2012-02-20 DIAGNOSIS — R82998 Other abnormal findings in urine: Secondary | ICD-10-CM

## 2012-02-20 DIAGNOSIS — R319 Hematuria, unspecified: Secondary | ICD-10-CM | POA: Insufficient documentation

## 2012-02-20 LAB — POCT URINALYSIS DIPSTICK
Bilirubin, UA: NEGATIVE
Glucose, UA: NEGATIVE
Leukocytes, UA: NEGATIVE
Nitrite, UA: NEGATIVE
Urobilinogen, UA: 0.2

## 2012-02-20 MED ORDER — CIPROFLOXACIN HCL 250 MG PO TABS: ORAL_TABLET | ORAL | Status: DC

## 2012-02-20 NOTE — Progress Notes (Signed)
CC: Mariah Beck is a 61 y.o. female is here for Urinary Tract Infection   Subjective: HPI:  Patient reports darkening of her urine that's been present since this past weekend. Interventions included drinking more water than usual which has caused the lesion but still noticing darkening on daily basis. She's never had a urinary tract infection in the past nor she ever had a condition like this before. She denies urinary frequency, dysuria, flank pain, lower abdominal pain, GI disturbance, vaginal bleeding, no pelvic trauma. She denies fevers, chills, nausea. Only recent changes in her lifestyle has included taking a statin but she denies any right or quadrant pain nor skin or scleral icterus.   Review Of Systems Outlined In HPI  Past Medical History  Diagnosis Date  . Arthritis   . Reactive airways dysfunction syndrome   . Lumbar spondylosis   . Depression   . Hypertension   . Obesity   . Plantar fasciitis   . CAD (coronary artery disease)     mild     Family History  Problem Relation Age of Onset  . Cancer Father   . Diabetes Father   . Hypertension Father   . Dementia Mother 54  . Cancer Brother   . Alcohol abuse Brother      History  Substance Use Topics  . Smoking status: Never Smoker   . Smokeless tobacco: Not on file  . Alcohol Use: Yes     occasiona'     Objective: Filed Vitals:   02/20/12 1007  BP: 144/82  Pulse: 75  Temp: 98 F (36.7 C)    General: Alert and Oriented, No Acute Distress HEENT: conjunctivae clear, no scleral icterus Lungs:   Comfortable work of breathing. Good air movement.  Abdomen: Normal bowel sounds, soft and non tender without palpable masses. Extremities: No peripheral edema.  Strong peripheral pulses. Mental Status: No depression, anxiety, nor agitation. .  Assessment & Plan: [name] was seen today for urinary tract infection.  Diagnoses and associated orders for this visit:  Hematuria - cause not known - Urinalysis  Dipstick - Urine culture - Urine Microscopic Only - ciprofloxacin (CIPRO) 250 MG tablet; Take one by mouth twice a day for five days.  Dark urine - Urine culture - Urine Microscopic Only     most likely urinary tract infection however will get a culture to ensure this. Urine microscopy to check for casts determine if her blood cells are intact and exam to quantitate. Empirically treated with Cipro.Signs and symptoms requring emergent/urgent reevaluation were discussed with the patient. A call her cell phone on Friday when preliminary urine results are expected to be back.  Return if symptoms worsen or fail to improve.

## 2012-02-20 NOTE — Addendum Note (Signed)
Addended by: Laren Boom on: 02/20/2012 10:39 AM   Modules accepted: Orders

## 2012-02-22 ENCOUNTER — Encounter: Payer: Self-pay | Admitting: *Deleted

## 2012-02-22 ENCOUNTER — Telehealth: Payer: Self-pay | Admitting: Family Medicine

## 2012-02-22 DIAGNOSIS — R319 Hematuria, unspecified: Secondary | ICD-10-CM

## 2012-02-22 MED ORDER — SULFAMETHOXAZOLE-TRIMETHOPRIM 800-160 MG PO TABS: ORAL_TABLET | ORAL | Status: AC

## 2012-02-22 NOTE — Telephone Encounter (Signed)
Mariah Lush., At you convenience will you please call Mariah Beck (on her mobile) and let her know that I got in touch with the lab and they have lost the urine sample for microscopy but they tell me they are running her urine culture.  Since her urine is still red-brown, if it's caused by a UTI it should have begun to clear by now and may represent a bacteria resistant to Cipro, therefore I've sent in a Bactrim Rx to her [facility] to be taken in place of Cipro now.  I doubt we'll have the culture back by closing time, so I'll call her this weekend once results are available. Thank you

## 2012-02-22 NOTE — Telephone Encounter (Signed)
Pt.notified

## 2012-02-23 ENCOUNTER — Telehealth: Payer: Self-pay | Admitting: Family Medicine

## 2012-02-23 DIAGNOSIS — R319 Hematuria, unspecified: Secondary | ICD-10-CM

## 2012-02-23 LAB — URINE CULTURE

## 2012-02-23 NOTE — Telephone Encounter (Addendum)
Patient contacted Will need at least ua micro, uro referral, cytology, consider urinary ct. Will start monday

## 2012-02-25 ENCOUNTER — Other Ambulatory Visit: Payer: Self-pay | Admitting: Family Medicine

## 2012-02-25 LAB — URINALYSIS, MICROSCOPIC ONLY
Bacteria, UA: NEGATIVE
Casts: NEGATIVE

## 2012-02-25 LAB — URINALYSIS, ROUTINE W REFLEX MICROSCOPIC
Glucose, UA: NEGATIVE mg/dL
Ketones, ur: NEGATIVE mg/dL
Nitrite: NEGATIVE
Protein, ur: 30 mg/dL — AB
pH: 6 (ref 5.0–8.0)

## 2012-02-26 ENCOUNTER — Telehealth: Payer: Self-pay | Admitting: *Deleted

## 2012-02-26 NOTE — Telephone Encounter (Signed)
Pt states that her urine doesn't have any blood in it at this time. She states it is dark in am but lightens as day goes on. States she has an appt with urology today but feels that since urine isn't bloody then they won't be able to tell anything. She ask if you think she should still go. States she has to pay for that out of pocket fully.

## 2012-02-26 NOTE — Telephone Encounter (Signed)
LMOM

## 2012-02-26 NOTE — Telephone Encounter (Signed)
I'm thrilled to hear that the bleeding improved, but I still think the safest thing would be to still visit with Urology since there was no evidence of a UTI.  Transient blood in the urinary tract really deserves attention if not in the setting of a UTI.

## 2012-03-11 ENCOUNTER — Other Ambulatory Visit: Payer: Self-pay | Admitting: *Deleted

## 2012-03-11 MED ORDER — SIMVASTATIN 40 MG PO TABS: 40.0000 mg | ORAL_TABLET | Freq: Every evening | ORAL | Status: DC

## 2012-03-20 ENCOUNTER — Other Ambulatory Visit: Payer: Self-pay | Admitting: Family Medicine

## 2012-03-20 DIAGNOSIS — Z1231 Encounter for screening mammogram for malignant neoplasm of breast: Secondary | ICD-10-CM

## 2012-04-01 ENCOUNTER — Ambulatory Visit (INDEPENDENT_AMBULATORY_CARE_PROVIDER_SITE_OTHER): Payer: Managed Care, Other (non HMO)

## 2012-04-01 DIAGNOSIS — Z1231 Encounter for screening mammogram for malignant neoplasm of breast: Secondary | ICD-10-CM

## 2012-04-02 ENCOUNTER — Other Ambulatory Visit: Payer: Self-pay | Admitting: Family Medicine

## 2012-04-11 ENCOUNTER — Other Ambulatory Visit: Payer: Self-pay | Admitting: *Deleted

## 2012-04-11 MED ORDER — SIMVASTATIN 40 MG PO TABS: 40.0000 mg | ORAL_TABLET | Freq: Every evening | ORAL | Status: DC

## 2012-04-23 ENCOUNTER — Other Ambulatory Visit: Payer: Self-pay | Admitting: *Deleted

## 2012-04-23 MED ORDER — ZOLPIDEM TARTRATE 10 MG PO TABS: 10.0000 mg | ORAL_TABLET | Freq: Every evening | ORAL | Status: DC | PRN

## 2012-05-02 ENCOUNTER — Encounter: Payer: Self-pay | Admitting: Family Medicine

## 2012-05-02 ENCOUNTER — Other Ambulatory Visit (HOSPITAL_COMMUNITY)
Admission: RE | Admit: 2012-05-02 | Discharge: 2012-05-02 | Disposition: A | Discharge status: Home / Self Care | Payer: Managed Care, Other (non HMO) | Source: Ambulatory Visit | Attending: Family Medicine | Admitting: Family Medicine

## 2012-05-02 ENCOUNTER — Ambulatory Visit (INDEPENDENT_AMBULATORY_CARE_PROVIDER_SITE_OTHER): Payer: Managed Care, Other (non HMO) | Admitting: Family Medicine

## 2012-05-02 VITALS — BP 130/71 | HR 71 | Ht 66.0 in | Wt 173.0 lb

## 2012-05-02 DIAGNOSIS — Z01419 Encounter for gynecological examination (general) (routine) without abnormal findings: Secondary | ICD-10-CM | POA: Insufficient documentation

## 2012-05-02 DIAGNOSIS — E783 Hyperchylomicronemia: Secondary | ICD-10-CM

## 2012-05-02 DIAGNOSIS — IMO0002 Reserved for concepts with insufficient information to code with codable children: Secondary | ICD-10-CM

## 2012-05-02 DIAGNOSIS — E785 Hyperlipidemia, unspecified: Secondary | ICD-10-CM

## 2012-05-02 DIAGNOSIS — Z1151 Encounter for screening for human papillomavirus (HPV): Secondary | ICD-10-CM | POA: Insufficient documentation

## 2012-05-02 MED ORDER — OSPEMIFENE 60 MG PO TABS: 60.0000 mg | ORAL_TABLET | Freq: Every day | ORAL | Status: DC

## 2012-05-02 NOTE — Patient Instructions (Addendum)
Keep up a regular exercise program and make sure you are eating a healthy diet Try to eat 4 servings of dairy a day, or if you are lactose intolerant take a calcium with vitamin D daily.  Your vaccines are up to date.   

## 2012-05-02 NOTE — Progress Notes (Signed)
Subjective:     Mariah Beck is a 61 y.o. female and is here for a comprehensive physical exam. The patient reports no problems. She heard about a new nonhormonal treatment for vaginal atrophy. She has a lot of dyspareunia, and has for quite some time but she has not been interested in hormonal treatments because of her family history of cancer.  History   Social History  . Marital Status: Married    Spouse Name: N/A    Number of Children: N/A  . Years of Education: N/A   Occupational History  . Not on file.   Social History Main Topics  . Smoking status: Never Smoker   . Smokeless tobacco: Not on file  . Alcohol Use: Yes     Comment: occasiona'  . Drug Use:   . Sexually Active:    Other Topics Concern  . Not on file   Social History Narrative  . No narrative on file   Health Maintenance  Topic Date Due  . Zostavax  11/19/2011  . Influenza Vaccine  01/27/2012  . Colonoscopy  03/31/2013  . Mammogram  04/01/2014  . Pap Smear  05/03/2015  . Tetanus/tdap  05/29/2015    The following portions of the patient's history were reviewed and updated as appropriate: allergies, current medications, past family history, past medical history, past social history, past surgical history and problem list.  Review of Systems A comprehensive review of systems was negative.   Objective:    BP 130/71  Pulse 71  Ht 5\' 6"  (1.676 m)  Wt 173 lb (78.472 kg)  BMI 27.92 kg/m2 General appearance: alert, cooperative and appears stated age Head: Normocephalic, without obvious abnormality Eyes: conj clear, EOMI, PEERLA Ears: normal TM's and external ear canals both ears Nose: Nares normal. Septum midline. Mucosa normal. No drainage or sinus tenderness. Throat: lips, mucosa, and tongue normal; teeth and gums normal Neck: no adenopathy, no carotid bruit, no JVD, supple, symmetrical, trachea midline and thyroid not enlarged, symmetric, no tenderness/mass/nodules Back: symmetric, no curvature. ROM  normal. No CVA tenderness. Lungs: clear to auscultation bilaterally Breasts: normal appearance, no masses or tenderness Heart: regular rate and rhythm, S1, S2 normal, no murmur, click, rub or gallop Abdomen: soft, non-tender; bowel sounds normal; no masses,  no organomegaly Pelvic: external genitalia normal, no adnexal masses or tenderness, no cervical motion tenderness, rectovaginal septum normal, uterus normal size, shape, and consistency, vagina normal without discharge and cervix with erythematous area approx at 12 oclock position.   she does have some white colored skin along the inner labia minora on the right. No peau d'orange look. No other surrounding erythema. She denies any itching in this area. Extremities: extremities normal, atraumatic, no cyanosis or edema Pulses: 2+ and symmetric Skin: Skin color, texture, turgor normal. No rashes or lesions Lymph nodes: Cervical, supraclavicular, and axillary nodes normal. Neurologic: Grossly normal    Assessment:    Healthy female exam.      Plan:     See After Visit Summary for Counseling Recommendations  Keep up a regular exercise program and make sure you are eating a healthy diet Try to eat 4 servings of dairy a day, or if you are lactose intolerant take a calcium with vitamin D daily.  Your vaccines are up to date.  We will call with Pap smear results once they're available.  Vaginal atrophy-given a prescription for Osphena.  I also gave her a printout about the medication from up-to-date. She can call if she  has any palms or concerns.  We'll keep an eye on the white area along the labia minora. It almost looks like scleroderma. She does not notice any symptoms discomfort or itching. We will keep an eye on this.

## 2012-05-07 DIAGNOSIS — N200 Calculus of kidney: Secondary | ICD-10-CM | POA: Insufficient documentation

## 2012-05-16 ENCOUNTER — Ambulatory Visit (INDEPENDENT_AMBULATORY_CARE_PROVIDER_SITE_OTHER): Payer: Managed Care, Other (non HMO) | Admitting: Family Medicine

## 2012-05-16 DIAGNOSIS — Z23 Encounter for immunization: Secondary | ICD-10-CM

## 2012-05-16 LAB — LIPID PANEL
HDL: 71 mg/dL (ref >39)
LDL Cholesterol: 52 mg/dL (ref 0–99)
Total CHOL/HDL Ratio: 1.9 Ratio

## 2012-05-16 LAB — COMPLETE METABOLIC PANEL WITH GFR
ALT: 15 U/L (ref 0–35)
AST: 17 U/L (ref 0–37)
Alkaline Phosphatase: 57 U/L (ref 39–117)
Sodium: 141 mEq/L (ref 135–145)
Total Bilirubin: 0.7 mg/dL (ref 0.3–1.2)
Total Protein: 5.9 g/dL — ABNORMAL LOW (ref 6.0–8.3)

## 2012-05-16 NOTE — Progress Notes (Signed)
  Subjective:    Patient ID: Mariah Beck, female    DOB: [birth year], 61 y.o.   MRN: [id number] Shingles vaccine HPI    Review of Systems     Objective:   Physical Exam        Assessment & Plan:

## 2012-05-19 ENCOUNTER — Other Ambulatory Visit: Payer: Self-pay | Admitting: Sports Medicine

## 2012-05-19 ENCOUNTER — Other Ambulatory Visit: Payer: Self-pay | Admitting: *Deleted

## 2012-05-19 MED ORDER — SIMVASTATIN 40 MG PO TABS: 40.0000 mg | ORAL_TABLET | Freq: Every evening | ORAL | Status: DC

## 2012-07-16 ENCOUNTER — Other Ambulatory Visit: Payer: Self-pay | Admitting: Family Medicine

## 2012-07-16 ENCOUNTER — Other Ambulatory Visit: Payer: Self-pay | Admitting: *Deleted

## 2012-07-16 MED ORDER — LOSARTAN POTASSIUM 50 MG PO TABS: ORAL_TABLET | ORAL | Status: DC

## 2012-07-25 ENCOUNTER — Telehealth: Payer: Self-pay | Admitting: *Deleted

## 2012-07-25 NOTE — Telephone Encounter (Signed)
Opened in error

## 2012-08-22 ENCOUNTER — Other Ambulatory Visit (HOSPITAL_COMMUNITY)
Admission: RE | Admit: 2012-08-22 | Discharge: 2012-08-22 | Disposition: A | Discharge status: Home / Self Care | Payer: Managed Care, Other (non HMO) | Source: Ambulatory Visit | Attending: Family Medicine | Admitting: Family Medicine

## 2012-08-22 ENCOUNTER — Encounter: Payer: Self-pay | Admitting: Family Medicine

## 2012-08-22 ENCOUNTER — Ambulatory Visit (INDEPENDENT_AMBULATORY_CARE_PROVIDER_SITE_OTHER): Payer: Managed Care, Other (non HMO) | Admitting: Family Medicine

## 2012-08-22 VITALS — BP 132/74 | HR 65 | Wt 180.0 lb

## 2012-08-22 DIAGNOSIS — N760 Acute vaginitis: Secondary | ICD-10-CM | POA: Insufficient documentation

## 2012-08-22 LAB — POCT URINALYSIS DIPSTICK
Bilirubin, UA: NEGATIVE
Glucose, UA: NEGATIVE
Nitrite, UA: NEGATIVE
Spec Grav, UA: 1.015
Urobilinogen, UA: 0.2

## 2012-08-22 MED ORDER — FLUCONAZOLE 150 MG PO TABS: 150.0000 mg | ORAL_TABLET | Freq: Once | ORAL | Status: DC

## 2012-08-22 NOTE — Progress Notes (Signed)
  Subjective:    Patient ID: Mariah Beck, female    DOB: [birth year], 62 y.o.   MRN: [id number]  HPI Vaginal itching on and off for 3 weeks.  Says did one tx with no relief. Then did a 2nd tx of miconazole and felt got some better.  Says urine looked cloudy about 2 days ago.  No vaingal d/c. No discomfort with urination. No hematuria.  No rash or sores.  No pelvic pain. Has been really tired for the last 3-4 weeks. The only thing new is that she did start Osphena and has started using a vaginal lubricant, Replens.   Review of Systems     Objective:   Physical Exam  Constitutional: She appears well-developed and well-nourished.  HENT:  Head: Normocephalic and atraumatic.  Genitourinary:  No external vaginal sores or lesions. No sign of abnormal discharge. The tissue does appear to be a little bit more white  along the right inner labia, but doesn't have the classic appearance for lichens sclerosis.  Skin: Skin is warm and dry.  Psychiatric: She has a normal mood and affect.          Assessment & Plan:  Vaginitis- since it is the weekend although and treat her with Diflucan. We'll send a wet prep for further evaluation. She's not had any abnormal discharge. We did do a urinalysis as well. Urinalysis was negative except for some trace leukocytes was less likely contamination. Call if she's not better by Monday or Tuesday. We will call her with results of the wet prep once they are available. I did look up the Osphena, and I did not find urinary tract infections or yeast infections under common side effects. The Replens as of his BPH neutral and should not really be causing her symptoms. We will continue to monitor this to see if she starts having recurrent infections.

## 2012-10-19 ENCOUNTER — Other Ambulatory Visit: Payer: Self-pay | Admitting: Family Medicine

## 2012-10-29 ENCOUNTER — Other Ambulatory Visit: Payer: Self-pay | Admitting: Family Medicine

## 2012-12-16 ENCOUNTER — Other Ambulatory Visit: Payer: Self-pay | Admitting: Sports Medicine

## 2013-02-16 ENCOUNTER — Other Ambulatory Visit: Payer: Self-pay | Admitting: Family Medicine

## 2013-03-12 DIAGNOSIS — J309 Allergic rhinitis, unspecified: Secondary | ICD-10-CM | POA: Insufficient documentation

## 2013-04-02 ENCOUNTER — Other Ambulatory Visit: Payer: Self-pay

## 2013-04-16 ENCOUNTER — Other Ambulatory Visit: Payer: Self-pay | Admitting: Family Medicine

## 2013-04-20 ENCOUNTER — Other Ambulatory Visit: Payer: Self-pay | Admitting: Family Medicine

## 2013-06-17 ENCOUNTER — Other Ambulatory Visit: Payer: Self-pay | Admitting: Family Medicine

## 2013-07-29 ENCOUNTER — Other Ambulatory Visit: Payer: Self-pay | Admitting: Family Medicine

## 2013-07-29 DIAGNOSIS — Z1231 Encounter for screening mammogram for malignant neoplasm of breast: Secondary | ICD-10-CM

## 2013-08-14 ENCOUNTER — Ambulatory Visit (INDEPENDENT_AMBULATORY_CARE_PROVIDER_SITE_OTHER): Payer: Managed Care, Other (non HMO) | Admitting: Family Medicine

## 2013-08-14 ENCOUNTER — Encounter: Payer: Self-pay | Admitting: Family Medicine

## 2013-08-14 VITALS — BP 127/66 | HR 63 | Wt 196.0 lb

## 2013-08-14 DIAGNOSIS — M25549 Pain in joints of unspecified hand: Secondary | ICD-10-CM

## 2013-08-14 DIAGNOSIS — M6530 Trigger finger, unspecified finger: Secondary | ICD-10-CM

## 2013-08-14 DIAGNOSIS — Z0000 Encounter for general adult medical examination without abnormal findings: Secondary | ICD-10-CM

## 2013-08-14 DIAGNOSIS — Z13220 Encounter for screening for lipoid disorders: Secondary | ICD-10-CM

## 2013-08-14 DIAGNOSIS — R7989 Other specified abnormal findings of blood chemistry: Secondary | ICD-10-CM

## 2013-08-14 MED ORDER — LOSARTAN POTASSIUM 50 MG PO TABS: ORAL_TABLET | ORAL | Status: DC

## 2013-08-14 MED ORDER — OSPEMIFENE 60 MG PO TABS: ORAL_TABLET | ORAL | Status: DC

## 2013-08-14 MED ORDER — SIMVASTATIN 40 MG PO TABS: ORAL_TABLET | ORAL | Status: DC

## 2013-08-14 MED ORDER — MELOXICAM 15 MG PO TABS: ORAL_TABLET | ORAL | Status: DC

## 2013-08-14 MED ORDER — ZOLPIDEM TARTRATE 10 MG PO TABS: ORAL_TABLET | ORAL | Status: DC

## 2013-08-14 MED ORDER — CYCLOBENZAPRINE HCL 10 MG PO TABS: ORAL_TABLET | ORAL | Status: DC

## 2013-08-14 NOTE — Patient Instructions (Signed)
complete physical examination Keep up a regular exercise program and make sure you are eating a healthy diet Try to eat 4 servings of dairy a day, or if you are lactose intolerant take a calcium with vitamin D daily.  Your vaccines are up to date.   

## 2013-08-14 NOTE — Progress Notes (Signed)
Subjective:     Mariah Beck is a 63 y.o. female and is here for a comprehensive physical exam. The patient reports problems - due for repeat colonoscopy. Due for labwork.. She would also like to talk about her joint pain. She says at the last couple months she's noticed some pain in the MCPs of her hands. She says this he felt more tight and swollen at times. She said she even had a coworker noticed that they look more shiny because the skin was tight and stretched. She denies any known family history of rheumatoid arthritis or psoriatic arthritis. She does report that her father has a history of gout. She's also noticed that her ring finger on her left hand and her middle finger on her right hand are catching. She says sometimes it happens at night when her hands choral and then she has to straighten out. This started happening several months ago. It's not painful, and happens infrequently.  History   Social History  . Marital Status: Married    Spouse Name: N/A    Number of Children: N/A  . Years of Education: N/A   Occupational History  . Not on file.   Social History Main Topics  . Smoking status: Never Smoker   . Smokeless tobacco: Not on file  . Alcohol Use: Yes     Comment: occasiona'  . Drug Use:   . Sexual Activity:    Other Topics Concern  . Not on file   Social History Narrative  . No narrative on file   Health Maintenance  Topic Date Due  . Influenza Vaccine  12/26/2012  . Colonoscopy  03/31/2013  . Mammogram  04/01/2014  . Pap Smear  05/03/2015  . Tetanus/tdap  05/29/2015  . Zostavax  Completed    The following portions of the patient's history were reviewed and updated as appropriate: allergies, current medications, past family history, past medical history, past social history, past surgical history and problem list.  Review of Systems A comprehensive review of systems was negative.   Objective:    BP 127/66  Pulse 63  Wt 196 lb (88.905  kg) General appearance: alert, cooperative and appears stated age Head: Normocephalic, without obvious abnormality, atraumatic Eyes: conj clear, EOMi, PEERLA Ears: normal TM's and external ear canals both ears Nose: Nares normal. Septum midline. Mucosa normal. No drainage or sinus tenderness. Throat: lips, mucosa, and tongue normal; teeth and gums normal Neck: no adenopathy, no carotid bruit, no JVD, supple, symmetrical, trachea midline and thyroid not enlarged, symmetric, no tenderness/mass/nodules Back: symmetric, no curvature. ROM normal. No CVA tenderness. Lungs: clear to auscultation bilaterally Breasts: normal appearance, no masses or tenderness Heart: regular rate and rhythm, S1, S2 normal, no murmur, click, rub or gallop Abdomen: soft, non-tender; bowel sounds normal; no masses,  no organomegaly Extremities: extremities normal, atraumatic, no cyanosis or edema Pulses: 2+ and symmetric Skin: Skin color, texture, turgor normal. No rashes or lesions Lymph nodes: Cervical, supraclavicular, and axillary nodes normal. Neurologic: Alert and oriented X 3, normal strength and tone. Normal symmetric reflexes. Normal coordination and gait   Hands-she does have a little bit of mild edema over the first and second MCPs on both hands. She also has a little bit dry skin over the knuckles as well. No significant tenderness. She does have triggering of her right middle finger and left ring finger. She's able to completely flatten both hands.   Assessment:    Healthy female exam.  Plan:     See After Visit Summary for Counseling Recommendations  Keep up a regular exercise program and make sure you are eating a healthy diet Try to eat 4 servings of dairy a day, or if you are lactose intolerant take a calcium with vitamin D daily.  Your vaccines are up to date.   Trigger finger- discussed treatment options, including injection. She declined at this time but says she will consider it. If it's  happening more frequently where she's starting to get pain and tenderness with the triggering that I encouraged her to cause back so that we can schedule her with our sports medicine physician.  Joint pain in the hand-she has noticed some pain and swelling in the joints of her hands over the last 2 months. She does have a family history of gout in her father but no other history of autoimmune disorders. She recently had blood work done with Dr. Loyal Gambler and her ferritin level was elevated. This can be an acute inflammatory marker. Thus we'll check a CRP, ANA, sedimentation rate, anti-CCP etc. today. Also check a uric acid level to evaluate for gout.  Elevated ferritin-see note above.

## 2013-08-18 ENCOUNTER — Ambulatory Visit: Payer: Managed Care, Other (non HMO)

## 2013-08-18 LAB — LIPID PANEL
CHOLESTEROL: 148 mg/dL (ref 0–200)
HDL: 50 mg/dL (ref >39)
LDL Cholesterol: 73 mg/dL (ref 0–99)
TRIGLYCERIDES: 127 mg/dL (ref <150)
Total CHOL/HDL Ratio: 3 Ratio
VLDL: 25 mg/dL (ref 0–40)

## 2013-08-18 LAB — SEDIMENTATION RATE: SED RATE: 4 mm/h (ref 0–22)

## 2013-08-18 LAB — IRON AND TIBC
%SAT: 45 % (ref 20–55)
Iron: 136 ug/dL (ref 42–145)
TIBC: 302 ug/dL (ref 250–470)
UIBC: 166 ug/dL (ref 125–400)

## 2013-08-18 LAB — RHEUMATOID FACTOR: Rhuematoid fact SerPl-aCnc: <10 IU/mL (ref <=14)

## 2013-08-18 LAB — C-REACTIVE PROTEIN

## 2013-08-18 LAB — URIC ACID: Uric Acid, Serum: 4.9 mg/dL (ref 2.4–7.0)

## 2013-08-19 LAB — FERRITIN: Ferritin: 259 ng/mL (ref 10–291)

## 2013-08-19 LAB — CYCLIC CITRUL PEPTIDE ANTIBODY, IGG: Cyclic Citrullin Peptide Ab: <2 U/mL (ref 0.0–5.0)

## 2013-08-19 LAB — ANA: Anti Nuclear Antibody(ANA): NEGATIVE

## 2013-08-20 ENCOUNTER — Telehealth: Payer: Self-pay | Admitting: *Deleted

## 2013-08-20 DIAGNOSIS — E785 Hyperlipidemia, unspecified: Secondary | ICD-10-CM

## 2013-08-20 NOTE — Telephone Encounter (Signed)
OK to stop simvastatin and we can recheck levels in 4 months off the meds. As for the BP med needs to stay on it.  Should take it every day!!!!!!!!!  Dangerous to take it every other day as can cause big fluctuations in pressures which can damage the tiny vessels.

## 2013-08-20 NOTE — Telephone Encounter (Signed)
Pt called and would like to know if she can d/c the simvastatin and losartan since her labs were better. She said that she has been taking these meds every other day for the past 2 months. Please advise.Mariah Beck [place]

## 2013-08-21 NOTE — Telephone Encounter (Signed)
Pt informed of recommendations.Mariah Beck [place]  

## 2013-09-04 ENCOUNTER — Ambulatory Visit (HOSPITAL_BASED_OUTPATIENT_CLINIC_OR_DEPARTMENT_OTHER)
Admission: RE | Admit: 2013-09-04 | Discharge: 2013-09-04 | Disposition: A | Discharge status: Home / Self Care | Payer: Managed Care, Other (non HMO) | Source: Ambulatory Visit | Attending: Family Medicine | Admitting: Family Medicine

## 2013-09-04 DIAGNOSIS — Z1231 Encounter for screening mammogram for malignant neoplasm of breast: Secondary | ICD-10-CM

## 2013-09-08 ENCOUNTER — Ambulatory Visit: Payer: Managed Care, Other (non HMO)

## 2013-10-05 ENCOUNTER — Other Ambulatory Visit: Payer: Self-pay | Admitting: Family Medicine

## 2014-02-15 ENCOUNTER — Other Ambulatory Visit: Payer: Self-pay | Admitting: Family Medicine

## 2014-03-02 ENCOUNTER — Other Ambulatory Visit: Payer: Self-pay | Admitting: Family Medicine

## 2014-03-12 ENCOUNTER — Other Ambulatory Visit: Payer: Self-pay

## 2014-04-05 ENCOUNTER — Other Ambulatory Visit: Payer: Self-pay | Admitting: Family Medicine

## 2014-05-03 ENCOUNTER — Other Ambulatory Visit: Payer: Self-pay | Admitting: Family Medicine

## 2014-05-03 MED ORDER — ZOLPIDEM TARTRATE 10 MG PO TABS: 10.0000 mg | ORAL_TABLET | Freq: Every evening | ORAL | Status: DC | PRN

## 2014-05-30 ENCOUNTER — Other Ambulatory Visit: Payer: Self-pay | Admitting: Family Medicine

## 2014-06-23 ENCOUNTER — Ambulatory Visit (INDEPENDENT_AMBULATORY_CARE_PROVIDER_SITE_OTHER): Payer: Managed Care, Other (non HMO) | Admitting: Family Medicine

## 2014-06-23 ENCOUNTER — Encounter: Payer: Self-pay | Admitting: Family Medicine

## 2014-06-23 VITALS — BP 132/73 | HR 72 | Ht 66.0 in | Wt 190.0 lb

## 2014-06-23 DIAGNOSIS — G4700 Insomnia, unspecified: Secondary | ICD-10-CM

## 2014-06-23 DIAGNOSIS — Z9289 Personal history of other medical treatment: Secondary | ICD-10-CM

## 2014-06-23 DIAGNOSIS — Z8659 Personal history of other mental and behavioral disorders: Secondary | ICD-10-CM

## 2014-06-23 DIAGNOSIS — I10 Essential (primary) hypertension: Secondary | ICD-10-CM

## 2014-06-23 MED ORDER — SIMVASTATIN 40 MG PO TABS: ORAL_TABLET | ORAL | Status: DC

## 2014-06-23 MED ORDER — CYCLOBENZAPRINE HCL 10 MG PO TABS: ORAL_TABLET | ORAL | Status: DC

## 2014-06-23 MED ORDER — ZOLPIDEM TARTRATE 10 MG PO TABS: ORAL_TABLET | ORAL | Status: DC

## 2014-06-23 MED ORDER — MELOXICAM 15 MG PO TABS: 15.0000 mg | ORAL_TABLET | Freq: Every day | ORAL | Status: DC

## 2014-06-23 NOTE — Progress Notes (Signed)
   Subjective:    Patient ID: Mariah Beck, female    DOB: [birth year], 64 y.o.   MRN: [id number]  HPI Hypertension- Pt denies chest pain, SOB, dizziness, or heart palpitations.  Taking meds as directed w/o problems.  Denies medication side effects.  She's lost 6 pounds which is fantastic.  She has been walking more. She has been eating better.   Insomnia - Doing well on Ambien. Not sleep walking. But can sleep eat sometimes.   She has a current diagnosis of asthma on her problem list that was put on there in 2009. She says she's actually never been formally tested for asthma and in fact there is a diagnosis of reactive airways disease that flares when she gets sick with bronchitis.  She is also concerned because she recently had tried to apply for insurance. The underwriter indicated that she had a history of thyroid promise, recurrent depression, asthma. She says she never remembers having any prior history of depression though it is on her problem list. She also denies any history of asthma as above. I we do not have any indication of thyroid problems on her problem list some not sure where this may have come from. She was also told that she had a colonoscopy in 2014. I verified to her that the last class we have on file first 2009. So I'm not sure where they got one in 2014. She reports that her last one was in 2009.  Review of Systems     Objective:   Physical Exam  Constitutional: She is oriented to person, place, and time. She appears well-developed and well-nourished.  HENT:  Head: Normocephalic and atraumatic.  Cardiovascular: Normal rate, regular rhythm and normal heart sounds.   Pulmonary/Chest: Effort normal and breath sounds normal.  Neurological: She is alert and oriented to person, place, and time.  Skin: Skin is warm and dry.  Psychiatric: She has a normal mood and affect. Her behavior is normal.          Assessment & Plan:  Insomnia - reminded her that the  medication is approved for 5 mg and we'll women. She's down tomograms for quite some time. Did discuss as well that if she is doing things like eating without remembering doing it that this could lead to other sleep activities that could be dangerous. Encouraged her to think about this. She may want to consider discontinuing the drug in starting something else. For now will refill for one year.  Hypertension-well-controlled on current regimen. Follow-up in 6 months.  Corrected her problem list to reflect that she has reactive airway disease instead of asthma.  I did go back to the chart to verify that she had had an episode of depression which she didn't 2011. Factor was treated with citalopram with Dr. Santiago Glad but when. It was resolved in 2012 and she has not had any recurrence. I will change her problem list reflect that she has a history of an episode of depression.

## 2014-08-30 ENCOUNTER — Other Ambulatory Visit: Payer: Self-pay | Admitting: Family Medicine

## 2014-09-03 ENCOUNTER — Encounter: Payer: Self-pay | Admitting: Physician Assistant

## 2014-09-03 ENCOUNTER — Ambulatory Visit (INDEPENDENT_AMBULATORY_CARE_PROVIDER_SITE_OTHER): Payer: Managed Care, Other (non HMO)

## 2014-09-03 ENCOUNTER — Ambulatory Visit (INDEPENDENT_AMBULATORY_CARE_PROVIDER_SITE_OTHER): Payer: Managed Care, Other (non HMO) | Admitting: Physician Assistant

## 2014-09-03 VITALS — BP 142/78 | HR 64 | Temp 98.0°F | Wt 195.0 lb

## 2014-09-03 DIAGNOSIS — J90 Pleural effusion, not elsewhere classified: Secondary | ICD-10-CM | POA: Diagnosis not present

## 2014-09-03 DIAGNOSIS — J42 Unspecified chronic bronchitis: Secondary | ICD-10-CM

## 2014-09-03 DIAGNOSIS — J189 Pneumonia, unspecified organism: Secondary | ICD-10-CM

## 2014-09-03 DIAGNOSIS — J45909 Unspecified asthma, uncomplicated: Secondary | ICD-10-CM

## 2014-09-03 MED ORDER — HYDROCOD POLST-CHLORPHEN POLST 10-8 MG/5ML PO LQCR: 5.0000 mL | Freq: Two times a day (BID) | ORAL | Status: DC | PRN

## 2014-09-03 MED ORDER — METHYLPREDNISOLONE SODIUM SUCC 125 MG IJ SOLR
125.0000 mg | Freq: Once | INTRAMUSCULAR | Status: AC
  Administered 2014-09-03: 125 mg via INTRAVENOUS

## 2014-09-03 MED ORDER — IPRATROPIUM-ALBUTEROL 0.5-2.5 (3) MG/3ML IN SOLN: 3.0000 mL | RESPIRATORY_TRACT | Status: DC | PRN

## 2014-09-03 MED ORDER — IPRATROPIUM-ALBUTEROL 0.5-2.5 (3) MG/3ML IN SOLN
3.0000 mL | Freq: Once | RESPIRATORY_TRACT | Status: AC
  Administered 2014-09-03: 3 mL via RESPIRATORY_TRACT

## 2014-09-03 MED ORDER — CLARITHROMYCIN 250 MG PO TABS: 250.0000 mg | ORAL_TABLET | Freq: Two times a day (BID) | ORAL | Status: DC

## 2014-09-03 NOTE — Progress Notes (Signed)
   Subjective:    Patient ID: Mariah Beck, female    DOB: Jun 28, 1950, 64 y.o.   MRN: [id number]  HPI Pt presents to the clinic with 2 weeks of symptoms and dx of pneumonia. [facility] a total of 3 times. She has been on zpak, prednisone for 5 days, doxycycline and albuterol inhaler. She feels some better but still has cough. She is exhausted. She occasionally will run a low grade temperature. Finished all medications. She just feels like she cannot catch a breath.    Review of Systems  All other systems reviewed and are negative.      Objective:   Physical Exam  Constitutional: She is oriented to person, place, and time.  Weak and fatigued.   HENT:  Head: Normocephalic and atraumatic.  Right Ear: External ear normal.  Left Ear: External ear normal.  Nose: Nose normal.  Mouth/Throat: Oropharynx is clear and moist.  Eyes: Conjunctivae are normal.  Neck: Normal range of motion. Neck supple.  Cardiovascular: Normal rate, regular rhythm and normal heart sounds.   Pulmonary/Chest: Effort normal and breath sounds normal. She has no wheezes.  Pulse ox 99 percent.   Continual dry cough.   Lymphadenopathy:    She has no cervical adenopathy.  Neurological: She is alert and oriented to person, place, and time.  Psychiatric: She has a normal mood and affect. Her behavior is normal.          Assessment & Plan:  Chronic bronchitis/CAP/reactive airway disease- peak flow in yellow. Given duoneb inhaler in office with some improvement. duoneb solution sent home to use. Solumedrol 125mg  IM given in office today. CXR revealed no pneumonia and trace bilateral pleural effusions. Pt request another round of abx. Slightly hesitant but gave biaxin due to pt already trying zpak and doxycycline. She also states that Levaquin in the past has made her feel worse. Did give her tussinonex for cough at bedtime.

## 2014-11-22 ENCOUNTER — Other Ambulatory Visit: Payer: Self-pay | Admitting: Family Medicine

## 2014-11-22 ENCOUNTER — Telehealth: Payer: Self-pay | Admitting: *Deleted

## 2014-11-22 DIAGNOSIS — I10 Essential (primary) hypertension: Secondary | ICD-10-CM

## 2014-11-22 NOTE — Telephone Encounter (Signed)
Called pt and informed her that she will need to schedule a f/u appt for a wellness exam. Pt stated that she had this done in feb or march. I told her that the last wellness exam on file that was done was in march 2015. And that she will need labs done to check her kidney and liver function on the bp meds. Lab faxed. Refill sent.Mariah Beck [place]

## 2014-12-02 LAB — BASIC METABOLIC PANEL WITH GFR
BUN: 20 mg/dL (ref 6–23)
CHLORIDE: 107 meq/L (ref 96–112)
CO2: 30 mEq/L (ref 19–32)
Calcium: 9.7 mg/dL (ref 8.4–10.5)
Creat: 0.64 mg/dL (ref 0.50–1.10)
GFR, Est African American: >89 mL/min
GLUCOSE: 91 mg/dL (ref 70–99)
POTASSIUM: 5 meq/L (ref 3.5–5.3)
Sodium: 145 mEq/L (ref 135–145)

## 2014-12-02 NOTE — Telephone Encounter (Signed)
Quick Note:  All labs are normal. ______ 

## 2014-12-09 LAB — LIPID PANEL
Cholesterol: 146 mg/dL (ref 0–200)
HDL: 57 mg/dL (ref 35–70)
LDL Cholesterol: 64 mg/dL
LDl/HDL Ratio: 2.6
Triglycerides: 126 mg/dL (ref 40–160)

## 2014-12-09 LAB — BASIC METABOLIC PANEL: GLUCOSE: 95 mg/dL

## 2014-12-27 ENCOUNTER — Other Ambulatory Visit: Payer: Self-pay | Admitting: Family Medicine

## 2014-12-28 ENCOUNTER — Other Ambulatory Visit: Payer: Self-pay | Admitting: *Deleted

## 2014-12-28 MED ORDER — ZOLPIDEM TARTRATE 10 MG PO TABS: ORAL_TABLET | ORAL | Status: DC

## 2015-01-12 ENCOUNTER — Encounter: Payer: Self-pay | Admitting: Family Medicine

## 2015-05-18 ENCOUNTER — Other Ambulatory Visit: Payer: Self-pay | Admitting: Family Medicine

## 2015-05-18 MED ORDER — AMBULATORY NON FORMULARY MEDICATION: Status: AC

## 2015-05-27 ENCOUNTER — Ambulatory Visit (INDEPENDENT_AMBULATORY_CARE_PROVIDER_SITE_OTHER): Payer: Managed Care, Other (non HMO) | Admitting: Family Medicine

## 2015-05-27 ENCOUNTER — Encounter: Payer: Self-pay | Admitting: Family Medicine

## 2015-05-27 VITALS — BP 146/73 | HR 61 | Wt 171.0 lb

## 2015-05-27 DIAGNOSIS — I10 Essential (primary) hypertension: Secondary | ICD-10-CM | POA: Diagnosis not present

## 2015-05-27 DIAGNOSIS — M4802 Spinal stenosis, cervical region: Secondary | ICD-10-CM | POA: Diagnosis not present

## 2015-05-27 DIAGNOSIS — G4700 Insomnia, unspecified: Secondary | ICD-10-CM | POA: Diagnosis not present

## 2015-05-27 DIAGNOSIS — M8949 Other hypertrophic osteoarthropathy, multiple sites: Secondary | ICD-10-CM

## 2015-05-27 DIAGNOSIS — M150 Primary generalized (osteo)arthritis: Secondary | ICD-10-CM | POA: Diagnosis not present

## 2015-05-27 DIAGNOSIS — M159 Polyosteoarthritis, unspecified: Secondary | ICD-10-CM

## 2015-05-27 MED ORDER — MELOXICAM 15 MG PO TABS
15.0000 mg | ORAL_TABLET | Freq: Every day | ORAL | Status: DC
Start: 2015-05-27 — End: 2015-11-27

## 2015-05-27 MED ORDER — ZOLPIDEM TARTRATE 10 MG PO TABS: ORAL_TABLET | ORAL | Status: DC

## 2015-05-27 MED ORDER — SIMVASTATIN 40 MG PO TABS: ORAL_TABLET | ORAL | Status: DC

## 2015-05-27 NOTE — Progress Notes (Signed)
   Subjective:    Patient ID: Mariah Beck, female    DOB: [birth year], 64 y.o.   MRN: [id number]  HPI Hypertension- Pt denies chest pain, SOB, dizziness, or heart palpitations.  Taking meds as directed w/o problems.  Denies medication side effects.  BP at home have beenin the 1-teens.   Insomnia - Occ uses 5mg  and doing well.   Most nights she does take 10mg .  She denies any excess sedation.  Osteoarthritis-she currently takes his meloxicam daily. She denies any GI upset or irritation or blood in the stool.  Has lost about 24 lb in the last 8 months. She has done fantastic. She is really trying to get healthy. She plans on retiring in June of this year. That she may be without health insurance for about a year until she is able to get Medicare..     Review of Systems     Objective:   Physical Exam  Constitutional: She is oriented to person, place, and time. She appears well-developed and well-nourished.  HENT:  Head: Normocephalic and atraumatic.  Cardiovascular: Normal rate, regular rhythm and normal heart sounds.   Pulmonary/Chest: Effort normal and breath sounds normal.  Neurological: She is alert and oriented to person, place, and time.  Skin: Skin is warm and dry.  Psychiatric: She has a normal mood and affect. Her behavior is normal.          Assessment & Plan:  Hypertension-she says her home blood pressures are very well controlled. Encouraged her to continue to keep an eye on those and I will see her back in 6 months. Next  Insomnia-doing well with the Ambien without any side effects. She is actually been trying to cut back to half a tab on some nights which is perfect. She said when she retires in the summer she can try to come off of it completely.   osteoarthritis/spinal stenosis-meloxicam refilled today. Stop immediately if any GI upset or irritation or blood in the stool. She seems to be tolerating it well.

## 2015-07-13 ENCOUNTER — Telehealth: Payer: Self-pay | Admitting: Family Medicine

## 2015-07-13 NOTE — Telephone Encounter (Signed)
Her cholesterol numbers on her wellness lab results looked fantastic. Just wanted to let her know the center into the system.

## 2015-07-14 NOTE — Telephone Encounter (Signed)
Notified patient.

## 2015-07-15 ENCOUNTER — Encounter: Payer: Self-pay | Admitting: Family Medicine

## 2015-10-11 ENCOUNTER — Telehealth: Payer: Self-pay | Admitting: Family Medicine

## 2015-10-11 NOTE — Telephone Encounter (Signed)
Left VM for Pt to return clinic call regarding medication.

## 2015-10-11 NOTE — Telephone Encounter (Signed)
Call patient: We got notification from [facility] that she had recently filled a prescription for oral meloxicam as well as the topical diclofenac. Both of these are NSAIDs and really should not be mixed. She probably needs to choose which one she would prefer to use or if she has days where she uses the meloxicam to avoid using the topical.

## 2015-10-20 NOTE — Telephone Encounter (Signed)
We also received notification that she does not have an albuterol inhaler on hand with her history of lung problems. She would like [place] to send whenever to her pharmacy them please let us know.

## 2015-10-20 NOTE — Telephone Encounter (Signed)
Pt does not want an inhaler sent. She stated that when she uses the mobic she does not use the voltaran gel.Mariah Beck [place]

## 2015-11-27 ENCOUNTER — Other Ambulatory Visit: Payer: Self-pay | Admitting: Family Medicine

## 2015-11-30 ENCOUNTER — Other Ambulatory Visit: Payer: Self-pay | Admitting: *Deleted

## 2015-11-30 NOTE — Telephone Encounter (Signed)
ERROR

## 2015-12-15 ENCOUNTER — Telehealth: Payer: Self-pay | Admitting: Family Medicine

## 2015-12-15 NOTE — Telephone Encounter (Signed)
Called pt to let know she was over due for appt but could not leave vm. Thanks

## 2016-02-25 ENCOUNTER — Other Ambulatory Visit: Payer: Self-pay | Admitting: Family Medicine

## 2016-02-28 ENCOUNTER — Other Ambulatory Visit: Payer: Self-pay | Admitting: Family Medicine

## 2016-02-29 ENCOUNTER — Telehealth: Payer: Self-pay | Admitting: Family Medicine

## 2016-02-29 MED ORDER — ZOLPIDEM TARTRATE 10 MG PO TABS: 10.0000 mg | ORAL_TABLET | Freq: Every evening | ORAL | 0 refills | Status: DC | PRN

## 2016-02-29 MED ORDER — LOSARTAN POTASSIUM 50 MG PO TABS: 50.0000 mg | ORAL_TABLET | Freq: Every day | ORAL | 1 refills | Status: DC

## 2016-02-29 MED ORDER — MELOXICAM 15 MG PO TABS: 15.0000 mg | ORAL_TABLET | Freq: Every day | ORAL | 1 refills | Status: DC

## 2016-02-29 NOTE — Telephone Encounter (Signed)
Refills sent for pt since she called and scheduled appt.Mariah Beck

## 2016-02-29 NOTE — Telephone Encounter (Signed)
Patient called to schedule an appt for her med refill and adv next avail 03/05/16 she stated she is completely out of Mobic and Ambien she scheduled appt for 10/9 and request to know if there is anyway she can get enough called in until she is seen Monday. Thanks

## 2016-03-05 ENCOUNTER — Ambulatory Visit (INDEPENDENT_AMBULATORY_CARE_PROVIDER_SITE_OTHER): Payer: Managed Care, Other (non HMO) | Admitting: Family Medicine

## 2016-03-05 ENCOUNTER — Encounter: Payer: Self-pay | Admitting: Family Medicine

## 2016-03-05 VITALS — BP 137/57 | HR 61 | Wt 187.0 lb

## 2016-03-05 DIAGNOSIS — I10 Essential (primary) hypertension: Secondary | ICD-10-CM

## 2016-03-05 DIAGNOSIS — G4700 Insomnia, unspecified: Secondary | ICD-10-CM

## 2016-03-05 DIAGNOSIS — E785 Hyperlipidemia, unspecified: Secondary | ICD-10-CM

## 2016-03-05 NOTE — Patient Instructions (Signed)
Track blood pressure daily for about 3 weeks. Then please make a nurse visit to come in and check pressure here, bring in her blood pressure log, and bring in her home blood pressure cuff so that we can check it against our machine.

## 2016-03-05 NOTE — Progress Notes (Signed)
Subjective:    CC: HTN  HPI: Hypertension- Pt denies chest pain, SOB, dizziness, or heart palpitations.  Taking meds as directed w/o problems.  Denies medication side effects.  She really wants to stop her BP pill. She has only been on a half a tab for the last year.   Insomnia - Currently on Ambien 10 mg. She is dong well on the medication and is requesting refills.    Hyperlipidemia-currently on simvastatin 40 mg.she only takes half a tab and wants to get off of that as well. She has been working out 3 days a week.    Past medical history, Surgical history, Family history not pertinant except as noted below, Social history, Allergies, and medications have been entered into the medical record, reviewed, and corrections made.   Review of Systems: No fevers, chills, night sweats, weight loss, chest pain, or shortness of breath.   Objective:    General: Well Developed, well nourished, and in no acute distress.  Neuro: Alert and oriented x3, extra-ocular muscles intact, sensation grossly intact.  HEENT: Normocephalic, atraumatic  Skin: Warm and dry, no rashes. Cardiac: Regular rate and rhythm, no murmurs rubs or gallops, no lower extremity edema.  Respiratory: Clear to auscultation bilaterally. Not using accessory muscles, speaking in full sentences.   Impression and Recommendations:   HTN - Well controlled today. She really wants to come off of her medication. I expect when she stops the medication her blood pressure will probably be in the 140s which would be too high but I'm willing to try it for 3 weeks. Encouraged her to keep a log at home and in 3 weeks come in with her log and bring her home blood pressure cuff so that we can verify the accuracy against our machine. If she does well we can continue to monitor her. She has made some lifestyle changes and has been working out regularly.  Insomnia - refilled her Ambien today.  Hyperlipidemia - due to recheck lipids. I encouraged her to  at least let's recheck the lipids on half of a tab which she has been taking for the last year before we decide to stop it completely.

## 2016-03-06 ENCOUNTER — Other Ambulatory Visit: Payer: Self-pay | Admitting: Family Medicine

## 2016-03-06 DIAGNOSIS — Z1239 Encounter for other screening for malignant neoplasm of breast: Secondary | ICD-10-CM

## 2016-03-06 LAB — LIPID PANEL
Cholesterol: 140 mg/dL (ref 125–200)
HDL: 54 mg/dL (ref >=46)
LDL CALC: 65 mg/dL (ref <130)
Total CHOL/HDL Ratio: 2.6 Ratio (ref <=5.0)
Triglycerides: 105 mg/dL (ref <150)
VLDL: 21 mg/dL (ref <30)

## 2016-03-06 LAB — COMPLETE METABOLIC PANEL WITH GFR
ALT: 20 U/L (ref 6–29)
AST: 21 U/L (ref 10–35)
Albumin: 4.3 g/dL (ref 3.6–5.1)
Alkaline Phosphatase: 61 U/L (ref 33–130)
BUN: 18 mg/dL (ref 7–25)
CHLORIDE: 106 mmol/L (ref 98–110)
CO2: 28 mmol/L (ref 20–31)
CREATININE: 0.7 mg/dL (ref 0.50–0.99)
Calcium: 9.8 mg/dL (ref 8.6–10.4)
GFR, Est African American: >89 mL/min (ref >=60)
GFR, Est Non African American: >89 mL/min (ref >=60)
Glucose, Bld: 91 mg/dL (ref 65–99)
Potassium: 4.5 mmol/L (ref 3.5–5.3)
Sodium: 141 mmol/L (ref 135–146)
TOTAL PROTEIN: 6.4 g/dL (ref 6.1–8.1)
Total Bilirubin: 0.8 mg/dL (ref 0.2–1.2)

## 2016-03-07 NOTE — Progress Notes (Signed)
All labs are normal. 

## 2016-03-13 ENCOUNTER — Ambulatory Visit (INDEPENDENT_AMBULATORY_CARE_PROVIDER_SITE_OTHER): Payer: Managed Care, Other (non HMO)

## 2016-03-13 DIAGNOSIS — Z1239 Encounter for other screening for malignant neoplasm of breast: Secondary | ICD-10-CM

## 2016-03-13 DIAGNOSIS — Z1231 Encounter for screening mammogram for malignant neoplasm of breast: Secondary | ICD-10-CM | POA: Diagnosis not present

## 2016-05-31 ENCOUNTER — Other Ambulatory Visit: Payer: Self-pay | Admitting: *Deleted

## 2016-05-31 MED ORDER — ZOLPIDEM TARTRATE 10 MG PO TABS: 10.0000 mg | ORAL_TABLET | Freq: Every evening | ORAL | 0 refills | Status: DC | PRN

## 2016-06-28 ENCOUNTER — Encounter: Payer: Self-pay | Admitting: Sports Medicine

## 2016-06-28 ENCOUNTER — Ambulatory Visit (INDEPENDENT_AMBULATORY_CARE_PROVIDER_SITE_OTHER): Payer: Managed Care, Other (non HMO)

## 2016-06-28 ENCOUNTER — Ambulatory Visit (INDEPENDENT_AMBULATORY_CARE_PROVIDER_SITE_OTHER): Payer: Managed Care, Other (non HMO) | Admitting: Sports Medicine

## 2016-06-28 DIAGNOSIS — J45909 Unspecified asthma, uncomplicated: Secondary | ICD-10-CM

## 2016-06-28 DIAGNOSIS — R509 Fever, unspecified: Secondary | ICD-10-CM

## 2016-06-28 DIAGNOSIS — R05 Cough: Secondary | ICD-10-CM

## 2016-06-28 MED ORDER — AZITHROMYCIN 250 MG PO TABS: ORAL_TABLET | ORAL | 0 refills | Status: DC

## 2016-06-28 MED ORDER — OSELTAMIVIR PHOSPHATE 75 MG PO CAPS: 75.0000 mg | ORAL_CAPSULE | Freq: Two times a day (BID) | ORAL | 0 refills | Status: DC

## 2016-06-28 MED ORDER — PREDNISONE 50 MG PO TABS: 50.0000 mg | ORAL_TABLET | Freq: Every day | ORAL | 0 refills | Status: DC

## 2016-06-28 NOTE — Assessment & Plan Note (Signed)
Symptoms clinically consistent with acute bronchitis, she does have a fever, and some other influenza-like illness symptoms, she is 1-1/2 days into symptoms we are going to hit her with Tamiflu. Considering her reactive airway disease, and she has a trip coming up to [place] tomorrow, I wouldn't usually treat someone like this with antibiotics we are going to hit her with steroids and azithromycin.

## 2016-06-28 NOTE — Progress Notes (Signed)
  Subjective:    CC: Persistent cough  HPI: For the past day and a half this pleasant 66 year old female has had fevers up to 100.9, muscle aches, body aches, cough that is nonproductive, chest tightness. No GI symptoms, minimal skin rash. She does have a trip coming up to [place] tomorrow. She did have a pneumonia couple of years ago that felt similar. No sick contacts. She did get her flu shot this season.  Past medical history:  Negative.  See flowsheet/record as well for more information.  Surgical history: Negative.  See flowsheet/record as well for more information.  Family history: Negative.  See flowsheet/record as well for more information.  Social history: Negative.  See flowsheet/record as well for more information.  Allergies, and medications have been entered into the medical record, reviewed, and no changes needed.   Review of Systems: No fevers, chills, night sweats, weight loss, chest pain, or shortness of breath.   Objective:    General: Well Developed, well nourished, and in no acute distress.  Neuro: Alert and oriented x3, extra-ocular muscles intact, sensation grossly intact.  HEENT: Normocephalic, atraumatic, pupils equal round reactive to light, neck supple, no masses, no lymphadenopathy, thyroid nonpalpable. Oropharynx, nasopharynx, ear canals unremarkable Skin: Warm and dry, no rashes. Cardiac: Regular rate and rhythm, no murmurs rubs or gallops, no lower extremity edema.  Respiratory: Clear to auscultation bilaterally. Not using accessory muscles, speaking in full sentences. Considerable coughing in the exam room.  Impression and Recommendations:    Reactive airway disease Symptoms clinically consistent with acute bronchitis, she does have a fever, and some other influenza-like illness symptoms, she is 1-1/2 days into symptoms we are going to hit her with Tamiflu. Considering her reactive airway disease, and she has a trip coming up to [place] tomorrow, I wouldn't  usually treat someone like this with antibiotics we are going to hit her with steroids and azithromycin.  I spent 25 minutes with this patient, greater than 50% was face-to-face time counseling regarding the above diagnoses

## 2016-07-19 ENCOUNTER — Other Ambulatory Visit: Payer: Self-pay | Admitting: Family Medicine

## 2016-08-30 ENCOUNTER — Telehealth: Payer: Self-pay

## 2016-08-30 ENCOUNTER — Other Ambulatory Visit: Payer: Self-pay | Admitting: Family Medicine

## 2016-08-30 ENCOUNTER — Other Ambulatory Visit: Payer: Self-pay

## 2016-08-30 MED ORDER — ZOLPIDEM TARTRATE 10 MG PO TABS: 10.0000 mg | ORAL_TABLET | Freq: Every evening | ORAL | 0 refills | Status: DC | PRN

## 2016-08-30 NOTE — Telephone Encounter (Signed)
Placed rx in your box for signature.

## 2016-08-30 NOTE — Telephone Encounter (Signed)
Sent refill and notified patient to make appointment.

## 2016-08-30 NOTE — Telephone Encounter (Signed)
Pt is requesting a refill of ambien sent to the [facility] address.  Please advise.

## 2016-08-30 NOTE — Telephone Encounter (Signed)
OK to refill but needs to schedule appt in next couple of months.

## 2016-11-26 ENCOUNTER — Telehealth: Payer: Self-pay | Admitting: Family Medicine

## 2016-11-26 MED ORDER — ZOLPIDEM TARTRATE 10 MG PO TABS: 10.0000 mg | ORAL_TABLET | Freq: Every evening | ORAL | 0 refills | Status: DC | PRN

## 2016-11-26 MED ORDER — MELOXICAM 15 MG PO TABS: ORAL_TABLET | ORAL | 0 refills | Status: DC

## 2016-11-26 MED ORDER — LOSARTAN POTASSIUM 50 MG PO TABS: ORAL_TABLET | ORAL | 0 refills | Status: DC

## 2016-11-26 NOTE — Telephone Encounter (Signed)
Patient called scheduled an appointment with Dr. Madilyn Fireman for next available and around work schedule for Tues 12/04/16 but after this Friday she will be out of her Meloxicam, Losartan and Ambien and request to get a refill this week sent to her new pharmacy [facility] on [address]. Thanks

## 2016-11-26 NOTE — Telephone Encounter (Signed)
rx sent.Mariah Beck Lyn[place]  

## 2016-12-04 ENCOUNTER — Encounter: Payer: Self-pay | Admitting: Family Medicine

## 2016-12-04 ENCOUNTER — Ambulatory Visit (INDEPENDENT_AMBULATORY_CARE_PROVIDER_SITE_OTHER): Payer: Medicare HMO | Admitting: Family Medicine

## 2016-12-04 VITALS — BP 139/68 | HR 63 | Ht 66.0 in | Wt 194.0 lb

## 2016-12-04 DIAGNOSIS — I10 Essential (primary) hypertension: Secondary | ICD-10-CM | POA: Diagnosis not present

## 2016-12-04 DIAGNOSIS — G4700 Insomnia, unspecified: Secondary | ICD-10-CM | POA: Diagnosis not present

## 2016-12-04 DIAGNOSIS — E7800 Pure hypercholesterolemia, unspecified: Secondary | ICD-10-CM

## 2016-12-04 DIAGNOSIS — Z23 Encounter for immunization: Secondary | ICD-10-CM | POA: Diagnosis not present

## 2016-12-04 DIAGNOSIS — M159 Polyosteoarthritis, unspecified: Secondary | ICD-10-CM

## 2016-12-04 DIAGNOSIS — G4733 Obstructive sleep apnea (adult) (pediatric): Secondary | ICD-10-CM | POA: Diagnosis not present

## 2016-12-04 DIAGNOSIS — M150 Primary generalized (osteo)arthritis: Secondary | ICD-10-CM

## 2016-12-04 DIAGNOSIS — M8949 Other hypertrophic osteoarthropathy, multiple sites: Secondary | ICD-10-CM

## 2016-12-04 MED ORDER — MELOXICAM 15 MG PO TABS: ORAL_TABLET | ORAL | 1 refills | Status: DC

## 2016-12-04 MED ORDER — LOSARTAN POTASSIUM 50 MG PO TABS: ORAL_TABLET | ORAL | 1 refills | Status: DC

## 2016-12-04 MED ORDER — ZOLPIDEM TARTRATE 10 MG PO TABS: 10.0000 mg | ORAL_TABLET | Freq: Every evening | ORAL | 1 refills | Status: DC | PRN

## 2016-12-04 NOTE — Progress Notes (Addendum)
Subjective:    CC: HTN   HPI: Hypertension- Pt denies chest pain, SOB, dizziness, or heart palpitations.  Taking meds as directed w/o problems.  Denies medication side effects.    Hyperlipidemia-she decided to stop taking her statin and is trying fish oil instead. Last lipid in October looked good. She is producing taking simvastatin 40 mg daily. She says it ws her preference. No side effects of the drug.  Insomnia - Still using her Ambien nightly. No side effects. She says over the last week and a half she hasn't been sleeping well at all. She's been taking melatonin and sometimes even Excedrin P.M. She's not sure why she is not sleeping well. She denies any changes in diet or caffeine intake. Though she does drink tea during the day. He denies any excess worrying etc.  Osteoarthritis-she does feel like her arthritis is getting a little worse particularly in her neck back and hips. She wants to know what else she can do for pain relief. She currently uses Tylenol arthritis, takes glucosamine. She feels like it's been particularly worse over the last 3 weeks. She's had some more stiffness but in the mornings when she first gets up.  Past medical history, Surgical history, Family history not pertinant except as noted below, Social history, Allergies, and medications have been entered into the medical record, reviewed, and corrections made.   Review of Systems: No fevers, chills, night sweats, weight loss, chest pain, or shortness of breath.   Objective:    General: Well Developed, well nourished, and in no acute distress.  Neuro: Alert and oriented x3, extra-ocular muscles intact, sensation grossly intact.  HEENT: Normocephalic, atraumatic  Skin: Warm and dry, no rashes. Cardiac: Regular rate and rhythm, no murmurs rubs or gallops, no lower extremity edema.  Respiratory: Clear to auscultation bilaterally. Not using accessory muscles, speaking in full sentences.   Impression and  Recommendations:   HTN - Well controlled. Continue current regimen. Follow up in  6 months.   Hyperlipidemia- due to recheck cholesterol.    Insomnia - Refilled medication for the next 6 months. Not sure why she's had a recent worsening of symptoms. Discussed strategies including backing off on caffeine. Making sure that she is exercising regularly. She does play pickle ball several days a week. Encouraged her to take a 20-30 minute walk in the evenings and this may help. Try to relax.  Osteoarthritis-suggested that she work on getting back on track with her diet. She evidently was traveling for several weeks and eating a lot of processed foods. We discussed getting back into a healthy diet with fresh fruits and vegetables. Lots of water. To reduce general inflammation in her body. If at that point her arthritis is not improving we can certainly take a look and decide if we need to do any further workup such as x-rays or even consider physical therapy.  Prevnar 13 given today.   Dicussed Shingrix today and Tdap. She will need to get these done at her pharmacy .

## 2017-01-23 DIAGNOSIS — R69 Illness, unspecified: Secondary | ICD-10-CM | POA: Diagnosis not present

## 2017-02-26 DIAGNOSIS — Z23 Encounter for immunization: Secondary | ICD-10-CM | POA: Diagnosis not present

## 2017-03-25 DIAGNOSIS — H524 Presbyopia: Secondary | ICD-10-CM | POA: Diagnosis not present

## 2017-03-25 DIAGNOSIS — H04123 Dry eye syndrome of bilateral lacrimal glands: Secondary | ICD-10-CM | POA: Diagnosis not present

## 2017-03-25 DIAGNOSIS — Z961 Presence of intraocular lens: Secondary | ICD-10-CM | POA: Diagnosis not present

## 2017-04-26 DIAGNOSIS — G4733 Obstructive sleep apnea (adult) (pediatric): Secondary | ICD-10-CM | POA: Diagnosis not present

## 2017-05-31 ENCOUNTER — Other Ambulatory Visit: Payer: Self-pay | Admitting: Family Medicine

## 2017-05-31 DIAGNOSIS — I10 Essential (primary) hypertension: Secondary | ICD-10-CM

## 2017-06-08 DIAGNOSIS — M791 Myalgia, unspecified site: Secondary | ICD-10-CM | POA: Diagnosis not present

## 2017-06-08 DIAGNOSIS — J018 Other acute sinusitis: Secondary | ICD-10-CM | POA: Diagnosis not present

## 2017-06-11 DIAGNOSIS — L820 Inflamed seborrheic keratosis: Secondary | ICD-10-CM | POA: Diagnosis not present

## 2017-06-12 ENCOUNTER — Telehealth: Payer: Self-pay | Admitting: Family Medicine

## 2017-06-12 NOTE — Telephone Encounter (Signed)
I called pt and informed her that she is overdue for a F/U on BP and she states that she will call back

## 2017-07-07 ENCOUNTER — Other Ambulatory Visit: Payer: Self-pay | Admitting: Family Medicine

## 2017-07-07 DIAGNOSIS — G4700 Insomnia, unspecified: Secondary | ICD-10-CM

## 2017-07-08 NOTE — Telephone Encounter (Signed)
Routed to pcp for signature.Jyquan Kenley Lynetta, CMA  

## 2017-07-09 DIAGNOSIS — H6691 Otitis media, unspecified, right ear: Secondary | ICD-10-CM | POA: Diagnosis not present

## 2017-07-18 ENCOUNTER — Encounter: Payer: Self-pay | Admitting: Family Medicine

## 2017-07-18 ENCOUNTER — Ambulatory Visit (INDEPENDENT_AMBULATORY_CARE_PROVIDER_SITE_OTHER): Payer: Medicare HMO | Admitting: Family Medicine

## 2017-07-18 VITALS — BP 131/65 | HR 55 | Ht 66.0 in | Wt 199.0 lb

## 2017-07-18 DIAGNOSIS — I10 Essential (primary) hypertension: Secondary | ICD-10-CM

## 2017-07-18 DIAGNOSIS — G4700 Insomnia, unspecified: Secondary | ICD-10-CM

## 2017-07-18 DIAGNOSIS — Z800 Family history of malignant neoplasm of digestive organs: Secondary | ICD-10-CM | POA: Diagnosis not present

## 2017-07-18 DIAGNOSIS — E7800 Pure hypercholesterolemia, unspecified: Secondary | ICD-10-CM

## 2017-07-18 DIAGNOSIS — M8949 Other hypertrophic osteoarthropathy, multiple sites: Secondary | ICD-10-CM

## 2017-07-18 DIAGNOSIS — M150 Primary generalized (osteo)arthritis: Secondary | ICD-10-CM | POA: Diagnosis not present

## 2017-07-18 DIAGNOSIS — R1012 Left upper quadrant pain: Secondary | ICD-10-CM

## 2017-07-18 DIAGNOSIS — M159 Polyosteoarthritis, unspecified: Secondary | ICD-10-CM

## 2017-07-18 MED ORDER — MELOXICAM 15 MG PO TABS: ORAL_TABLET | ORAL | 1 refills | Status: DC

## 2017-07-18 MED ORDER — LOSARTAN POTASSIUM 50 MG PO TABS: ORAL_TABLET | ORAL | 1 refills | Status: DC

## 2017-07-18 MED ORDER — ZOLPIDEM TARTRATE 10 MG PO TABS: 10.0000 mg | ORAL_TABLET | Freq: Every evening | ORAL | 0 refills | Status: DC | PRN

## 2017-07-18 NOTE — Progress Notes (Signed)
Subjective:    CC: BP and recheck ear.   HPI:  Hypertension- Pt denies chest pain, SOB, dizziness, or heart palpitations.  Taking meds as directed w/o problems.  Denies medication side effects.    F/U insomnia - requesting refill.  Doing well on Ambien. Says gets about 4 solid hours of sleep on it and then usually wakes up. Sometimes can fall back asleep.    REcheck right ear. She has had pain in her right ear canal. Says it comes and goes and it throbs at times.  Was seen at  [facility] on 07/09/17 and rx Augmentin for middle ear infection. She has some cerumen that was irrigated that day as well. She has one more day of antibiotic and just doesn't feel her pain is better.  No hearing loss or inner ear pain. She feels like it is towards the outside of the canal.   Also c/o of LUQ pain  That is intermittant.  Says not that painful. Says will get some early satiety after eating but not every time and then will have discomfort in the LUQ. She denies any nausea or vomiting .  Brother dx age 31 with pancreatic cancer and father in his 84s dx with pancreatic cancer.    BP 131/65   Pulse (!) 55   Ht 5\' 6"  (1.676 m)   Wt 199 lb (90.3 kg)   SpO2 98%   BMI 32.12 kg/m     Allergies  Allergen Reactions  . Acyclovir And Related   . Eszopiclone     REACTION: hauulcinations  . Levofloxacin     Other reaction(s): Other (See Comments) Body aches    Past Medical History:  Diagnosis Date  . Arthritis   . CAD (coronary artery disease)    mild  . Depression   . Hypertension   . Lumbar spondylosis   . Obesity   . Plantar fasciitis   . Reactive airways dysfunction syndrome [facility])     Past Surgical History:  Procedure Laterality Date  . APPENDECTOMY    . CARDIAC CATHETERIZATION    . D & C with hysteroscopy    . LT orif    . LTCS    . tonsillectomy and adnoidectomy      Social History   Socioeconomic History  . Marital status: Married    Spouse name: Not on file  . Number of children: Not  on file  . Years of education: Not on file  . Highest education level: Not on file  Social Needs  . Financial resource strain: Not on file  . Food insecurity - worry: Not on file  . Food insecurity - inability: Not on file  . Transportation needs - medical: Not on file  . Transportation needs - non-medical: Not on file  Occupational History  . Not on file  Tobacco Use  . Smoking status: Never Smoker  . Smokeless tobacco: Never Used  Substance and Sexual Activity  . Alcohol use: Yes    Comment: occasiona'  . Drug use: Not on file  . Sexual activity: Not on file  Other Topics Concern  . Not on file  Social History Narrative  . Not on file    Family History  Problem Relation Age of Onset  . Cancer Father   . Diabetes Father   . Hypertension Father   . Dementia Mother 56  . Cancer Brother   . Alcohol abuse Brother   . Gout Father     Outpatient Encounter  Medications as of 07/18/2017  Medication Sig  . AMBULATORY NON FORMULARY MEDICATION Respironics auto-pap CPAP machine. Dx: Z61.09. Fax to [facility]: [phone].  Marland Kitchen aspirin 81 MG EC tablet Take 81 mg by mouth daily.    . cyclobenzaprine (FLEXERIL) 10 MG tablet TAKE 1 TABLET (10 MG TOTAL) BY MOUTH DAILY.  . fish oil-omega-3 fatty acids 1000 MG capsule Take 1 g by mouth daily.    Marland Kitchen losartan (COZAAR) 50 MG tablet TAKE 1 TABLET BY MOUTH ONCE DAILY  . meloxicam (MOBIC) 15 MG tablet TAKE 1 TABLET BY MOUTH ONCE DAILY.  Marland Kitchen zolpidem (AMBIEN) 10 MG tablet Take 1 tablet (10 mg total) by mouth at bedtime as needed. for sleep  . [DISCONTINUED] losartan (COZAAR) 50 MG tablet TAKE 1 TABLET BY MOUTH ONCE DAILYPLEASE CALL OFFICE TO SCHEDULE AN APPOINTMENT 30 DAY SUPPLY ONLY  . [DISCONTINUED] meloxicam (MOBIC) 15 MG tablet TAKE 1 TABLET BY MOUTH ONCE DAILY. LAST REFILL. PLEASE CALL OFFICE TO SCHEDULE AN APPOINTMENT 30 DAY SUPPLY ONLY  . [DISCONTINUED] zolpidem (AMBIEN) 10 MG tablet Take 1 tablet (10 mg total) by mouth at bedtime as needed. for  sleep.appointment needed for additional refills. Please call and schedule an appointment.   No facility-administered encounter medications on file as of 07/18/2017.      Marland Kitchen   Review of Systems: No fevers, chills, night sweats, weight loss, chest pain, or shortness of breath.   Objective:    General: Well Developed, well nourished, and in no acute distress.  Neuro: Alert and oriented x3, extra-ocular muscles intact, sensation grossly intact.  HEENT: Normocephalic, atraumatic  Skin: Warm and dry, no rashes. Cardiac: Regular rate and rhythm, no murmurs rubs or gallops, no lower extremity edema.  Respiratory: Clear to auscultation bilaterally. Not using accessory muscles, speaking in full sentences. Abd: soft, nontender on exam. No fullness or HSM.   Impression and Recommendations:    HTN - Well controlled. Continue current regimen. Follow up in  6 months.  Due for labs.    Insomnia - refilled her Ambien.   Hyperlipidemia-due to recheck lipid panel.  Fasting lab slip provided.  Right ear pain - exam is normal. No inner ear infection. Ok to stop antibiotic.  If pain doesn't resolve in a couple of weeks can refer to ENT. She will call.    LUQ pain  With fam hx of pancreatic cancer.  Will check amylase/lipase and CA19-9.  Offered to schedule CT with contrast. She wants to think about it.    Encouraged her to schedule pap smear.

## 2017-07-19 DIAGNOSIS — Z809 Family history of malignant neoplasm, unspecified: Secondary | ICD-10-CM | POA: Diagnosis not present

## 2017-07-19 DIAGNOSIS — R1012 Left upper quadrant pain: Secondary | ICD-10-CM | POA: Diagnosis not present

## 2017-07-19 DIAGNOSIS — Z800 Family history of malignant neoplasm of digestive organs: Secondary | ICD-10-CM | POA: Diagnosis not present

## 2017-07-19 DIAGNOSIS — E7800 Pure hypercholesterolemia, unspecified: Secondary | ICD-10-CM | POA: Diagnosis not present

## 2017-07-19 DIAGNOSIS — I10 Essential (primary) hypertension: Secondary | ICD-10-CM | POA: Diagnosis not present

## 2017-07-21 LAB — LIPID PANEL W/REFLEX DIRECT LDL
CHOL/HDL RATIO: 5.3 (calc) — AB (ref <5.0)
Cholesterol: 244 mg/dL — ABNORMAL HIGH (ref <200)
HDL: 46 mg/dL — AB (ref >50)
LDL CHOLESTEROL (CALC): 172 mg/dL — AB
Non-HDL Cholesterol (Calc): 198 mg/dL (calc) — ABNORMAL HIGH (ref <130)
Triglycerides: 127 mg/dL (ref <150)

## 2017-07-21 LAB — COMPLETE METABOLIC PANEL WITH GFR
AG RATIO: 2.3 (calc) (ref 1.0–2.5)
ALKALINE PHOSPHATASE (APISO): 67 U/L (ref 33–130)
ALT: 20 U/L (ref 6–29)
AST: 18 U/L (ref 10–35)
Albumin: 4.5 g/dL (ref 3.6–5.1)
BILIRUBIN TOTAL: 0.7 mg/dL (ref 0.2–1.2)
BUN: 19 mg/dL (ref 7–25)
CHLORIDE: 107 mmol/L (ref 98–110)
CO2: 25 mmol/L (ref 20–32)
Calcium: 9.9 mg/dL (ref 8.6–10.4)
Creat: 0.71 mg/dL (ref 0.50–0.99)
GFR, EST AFRICAN AMERICAN: 104 mL/min/{1.73_m2} (ref >=60)
GFR, Est Non African American: 89 mL/min/{1.73_m2} (ref >=60)
Globulin: 2 g/dL (calc) (ref 1.9–3.7)
Glucose, Bld: 96 mg/dL (ref 65–99)
POTASSIUM: 4.6 mmol/L (ref 3.5–5.3)
Sodium: 140 mmol/L (ref 135–146)
Total Protein: 6.5 g/dL (ref 6.1–8.1)

## 2017-07-21 LAB — CBC
HCT: 40.8 % (ref 35.0–45.0)
HEMOGLOBIN: 13.9 g/dL (ref 11.7–15.5)
MCH: 29.6 pg (ref 27.0–33.0)
MCHC: 34.1 g/dL (ref 32.0–36.0)
MCV: 86.8 fL (ref 80.0–100.0)
MPV: 10.1 fL (ref 7.5–12.5)
PLATELETS: 174 10*3/uL (ref 140–400)
RBC: 4.7 10*6/uL (ref 3.80–5.10)
RDW: 12.4 % (ref 11.0–15.0)
WBC: 4.9 10*3/uL (ref 3.8–10.8)

## 2017-07-21 LAB — CANCER ANTIGEN 19-9: CA 19-9: 10 U/mL (ref <34)

## 2017-07-21 LAB — LIPASE: LIPASE: 31 U/L (ref 7–60)

## 2017-07-21 LAB — AMYLASE: AMYLASE: 32 U/L (ref 21–101)

## 2017-08-08 ENCOUNTER — Telehealth: Payer: Self-pay | Admitting: Family Medicine

## 2017-08-08 NOTE — Telephone Encounter (Signed)
I called patient and left a voicemail for her to schedule a f/u on BP and sleep with Dr. Madilyn Fireman

## 2017-08-14 ENCOUNTER — Telehealth: Payer: Self-pay | Admitting: Family Medicine

## 2017-08-14 MED ORDER — TELMISARTAN 40 MG PO TABS: 40.0000 mg | ORAL_TABLET | Freq: Every day | ORAL | 1 refills | Status: DC

## 2017-08-14 NOTE — Telephone Encounter (Signed)
Pt advised. She will call to schedule the nurse visit closer to time. No further questions.

## 2017-08-14 NOTE — Telephone Encounter (Signed)
Patient and let her know that we got a little notification from the pharmacy saying that losartan has been recalled.  We send a new prescription.  I would love for her to come back in about 3-4 weeks of her just a nurse blood pressure check to make sure that her blood pressure still looks good on the new one.  It is similar in how it works but they are not exact.

## 2017-08-14 NOTE — Telephone Encounter (Signed)
LM on Vm to call office back in regards to her BP medication. KG LPN

## 2017-09-12 DIAGNOSIS — R69 Illness, unspecified: Secondary | ICD-10-CM | POA: Diagnosis not present

## 2017-11-09 ENCOUNTER — Other Ambulatory Visit: Payer: Self-pay | Admitting: Family Medicine

## 2017-11-09 DIAGNOSIS — G4700 Insomnia, unspecified: Secondary | ICD-10-CM

## 2017-11-11 NOTE — Telephone Encounter (Signed)
Sent to pcp for signature.Mariah Beck, [place]

## 2018-01-08 ENCOUNTER — Other Ambulatory Visit: Payer: Self-pay | Admitting: Family Medicine

## 2018-01-08 NOTE — Telephone Encounter (Signed)
Spoke w/pt she informed me that she will be going on vacation 9/3-9/30-2019. She will need refills on her flexeril, micardis, and ambien. She has a f/u appt on 01/20/18 and asked if she needed to keep this appt. I looked back in her chart and her last OV was 07/18/17 she was instructed to f/u in 4 mos. (11/15/17), pt is due for labs. She asked that the ambien be sent to  [facility] in [place].

## 2018-01-08 NOTE — Telephone Encounter (Signed)
Pt called.  She is going on vacation on Sept 2nd and will need refill on some of her meds.

## 2018-01-08 NOTE — Telephone Encounter (Signed)
Left VM for Pt to see what Rx she needs refilled.

## 2018-01-09 ENCOUNTER — Other Ambulatory Visit: Payer: Self-pay | Admitting: Family Medicine

## 2018-01-09 DIAGNOSIS — M8949 Other hypertrophic osteoarthropathy, multiple sites: Secondary | ICD-10-CM

## 2018-01-09 DIAGNOSIS — M150 Primary generalized (osteo)arthritis: Principal | ICD-10-CM

## 2018-01-09 DIAGNOSIS — M159 Polyosteoarthritis, unspecified: Secondary | ICD-10-CM

## 2018-01-09 NOTE — Telephone Encounter (Signed)
Thank you :)

## 2018-01-10 NOTE — Telephone Encounter (Signed)
Patient states she is leaving before the refills will be due to fill. She will need new prescriptions along with an ok to fill early. She is worried there will not be enough time to get insurance approval before she leaves.

## 2018-01-14 MED ORDER — CYCLOBENZAPRINE HCL 10 MG PO TABS: ORAL_TABLET | ORAL | 0 refills | Status: AC

## 2018-01-15 NOTE — Telephone Encounter (Signed)
Keep appointment on August 26 then we will refill medicines then.

## 2018-01-15 NOTE — Telephone Encounter (Signed)
Patient notified via VM. KG LPN

## 2018-01-20 ENCOUNTER — Ambulatory Visit (INDEPENDENT_AMBULATORY_CARE_PROVIDER_SITE_OTHER): Payer: Medicare HMO | Admitting: Family Medicine

## 2018-01-20 ENCOUNTER — Encounter: Payer: Self-pay | Admitting: Family Medicine

## 2018-01-20 VITALS — BP 118/57 | HR 57 | Ht 66.0 in | Wt 199.0 lb

## 2018-01-20 DIAGNOSIS — I10 Essential (primary) hypertension: Secondary | ICD-10-CM | POA: Diagnosis not present

## 2018-01-20 DIAGNOSIS — Z23 Encounter for immunization: Secondary | ICD-10-CM

## 2018-01-20 DIAGNOSIS — M159 Polyosteoarthritis, unspecified: Secondary | ICD-10-CM

## 2018-01-20 DIAGNOSIS — M150 Primary generalized (osteo)arthritis: Secondary | ICD-10-CM

## 2018-01-20 DIAGNOSIS — G4700 Insomnia, unspecified: Secondary | ICD-10-CM | POA: Diagnosis not present

## 2018-01-20 DIAGNOSIS — M8949 Other hypertrophic osteoarthropathy, multiple sites: Secondary | ICD-10-CM

## 2018-01-20 MED ORDER — ZOLPIDEM TARTRATE 10 MG PO TABS: 10.0000 mg | ORAL_TABLET | Freq: Every evening | ORAL | 1 refills | Status: DC | PRN

## 2018-01-20 NOTE — Addendum Note (Signed)
Addended by: Teddy Spike on: 01/20/2018 03:26 PM   Modules accepted: Orders

## 2018-01-20 NOTE — Progress Notes (Signed)
Subjective:    CC: BP check   HPI:  Hypertension- Pt denies chest pain, SOB, dizziness, or heart palpitations.  Taking meds as directed w/o problems.  Denies medication side effects.  The pressure pill was changed because her losartan was on recall back in March.  Follow-up for chronic insomnia-she is currently on Ambien 10 mg nightly.  Requesting a refill today.  He did want me to review her medications and supplements today to see if there is anything that she no longer needs to take.  She actually is planning on discontinuing glucosamine and just using turmeric.  She also taken anti-inflammatories while Tylenol arthritis for her arthritis.  Past medical history, Surgical history, Family history not pertinant except as noted below, Social history, Allergies, and medications have been entered into the medical record, reviewed, and corrections made.   Review of Systems: No fevers, chills, night sweats, weight loss, chest pain, or shortness of breath.   Objective:    General: Well Developed, well nourished, and in no acute distress.  Neuro: Alert and oriented x3, extra-ocular muscles intact, sensation grossly intact.  HEENT: Normocephalic, atraumatic  Skin: Warm and dry, no rashes. Cardiac: Regular rate and rhythm, no murmurs rubs or gallops, no lower extremity edema.  Respiratory: Clear to auscultation bilaterally. Not using accessory muscles, speaking in full sentences.   Impression and Recommendations:   HTN  -controlled.  Continue current regimen.  Due to recheck renal function since we did have to switch arms.  Chronic insomnia - refilled Ambien for the next 6 months.  Follow-up in 6 months.  Osteoarthritis of multiple joints-does rely on the Mobic and Tylenol arthritis and turmeric.  Okay to discontinue glucosamine/chondroitin.  In looking of her medications also encouraged her to stop her aspirin as she has no specific indication to take it based on new guidelines.  Pneumovax  23 given today.  She did update me she has actually started her shingles vaccine series already.  We will get that updated in the system.  She follows up in October for her second 1.

## 2018-01-21 LAB — BASIC METABOLIC PANEL WITH GFR
BUN / CREAT RATIO: 30 (calc) — AB (ref 6–22)
BUN: 27 mg/dL — ABNORMAL HIGH (ref 7–25)
CO2: 30 mmol/L (ref 20–32)
Calcium: 10.8 mg/dL — ABNORMAL HIGH (ref 8.6–10.4)
Chloride: 106 mmol/L (ref 98–110)
Creat: 0.89 mg/dL (ref 0.50–0.99)
GFR, Est African American: 78 mL/min/{1.73_m2} (ref >=60)
GFR, Est Non African American: 68 mL/min/{1.73_m2} (ref >=60)
GLUCOSE: 101 mg/dL — AB (ref 65–99)
POTASSIUM: 4.5 mmol/L (ref 3.5–5.3)
SODIUM: 141 mmol/L (ref 135–146)

## 2018-01-22 ENCOUNTER — Other Ambulatory Visit: Payer: Self-pay

## 2018-01-23 DIAGNOSIS — R69 Illness, unspecified: Secondary | ICD-10-CM | POA: Diagnosis not present

## 2018-01-24 LAB — PTH, INTACT AND CALCIUM
Calcium: 10.2 mg/dL (ref 8.6–10.4)
PTH: 40 pg/mL (ref 14–64)

## 2018-01-28 ENCOUNTER — Other Ambulatory Visit: Payer: Self-pay | Admitting: *Deleted

## 2018-03-05 LAB — CALCIUM: Calcium: 10.1 mg/dL (ref 8.6–10.4)

## 2018-03-06 ENCOUNTER — Encounter: Payer: Self-pay | Admitting: Family Medicine

## 2018-03-06 ENCOUNTER — Ambulatory Visit (INDEPENDENT_AMBULATORY_CARE_PROVIDER_SITE_OTHER): Payer: Medicare HMO | Admitting: Family Medicine

## 2018-03-06 VITALS — BP 120/52 | HR 62 | Ht 66.0 in | Wt 201.0 lb

## 2018-03-06 DIAGNOSIS — H8111 Benign paroxysmal vertigo, right ear: Secondary | ICD-10-CM | POA: Diagnosis not present

## 2018-03-06 NOTE — Patient Instructions (Signed)
Call if not better on Monday and I will refer you to Vestibular rehab

## 2018-03-06 NOTE — Progress Notes (Signed)
Subjective:    Patient ID: Mariah Beck, female    DOB: [birth year], 67 y.o.   MRN: [id number]  HPI  67 yo female with a hx of HTN and OSA on CPAP  comes in today c/o of headaches and dizziness. About 3-4 weeks ago she was in [place] and they were traveling and started feeling dizzy.  She started waking up with HA around that time.  On Sunday when the lights went off in the theatre she saw shadows. No visual changes.  No fever, sweats, or chills. Feels like she is moving sideways when she walks.  Her neck feels tight and sore. No change in her medications.  She feels dizzy with turning her head.  + nausea but not vomiting.    No hearing loss or ear ringing.  She does have chronic headaches and says that these are not necessarily unusual from her regular headaches he just have ramped up more recently.  She denies any sinus pressure congestion or allergy symptoms. No alleviating factors.   Review of Systems  BP (!) 120/52   Pulse 62   Ht 5\' 6"  (1.676 m)   Wt 201 lb (91.2 kg)   SpO2 98%   BMI 32.44 kg/m     Allergies  Allergen Reactions  . Acyclovir And Related   . Eszopiclone     REACTION: hauulcinations  . Levofloxacin     Other reaction(s): Other (See Comments) Body aches Body aches    Past Medical History:  Diagnosis Date  . Arthritis   . CAD (coronary artery disease)    mild  . Depression   . Hypertension   . Lumbar spondylosis   . Obesity   . Plantar fasciitis   . Reactive airways dysfunction syndrome [facility])     Past Surgical History:  Procedure Laterality Date  . APPENDECTOMY    . CARDIAC CATHETERIZATION    . D & C with hysteroscopy    . LT orif    . LTCS    . tonsillectomy and adnoidectomy      Social History   Socioeconomic History  . Marital status: Married    Spouse name: Not on file  . Number of children: Not on file  . Years of education: Not on file  . Highest education level: Not on file  Occupational History  . Not on file  Social Needs  .  Financial resource strain: Not on file  . Food insecurity:    Worry: Not on file    Inability: Not on file  . Transportation needs:    Medical: Not on file    Non-medical: Not on file  Tobacco Use  . Smoking status: Never Smoker  . Smokeless tobacco: Never Used  Substance and Sexual Activity  . Alcohol use: Yes    Comment: occasiona'  . Drug use: Not on file  . Sexual activity: Not on file  Lifestyle  . Physical activity:    Days per week: Not on file    Minutes per session: Not on file  . Stress: Not on file  Relationships  . Social connections:    Talks on phone: Not on file    Gets together: Not on file    Attends religious service: Not on file    Active member of club or organization: Not on file    Attends meetings of clubs or organizations: Not on file    Relationship status: Not on file  . Intimate partner violence:  Fear of current or ex partner: Not on file    Emotionally abused: Not on file    Physically abused: Not on file    Forced sexual activity: Not on file  Other Topics Concern  . Not on file  Social History Narrative  . Not on file    Family History  Problem Relation Age of Onset  . Cancer Father   . Diabetes Father   . Hypertension Father   . Gout Father   . Dementia Mother 25  . Cancer Brother   . Alcohol abuse Brother     Outpatient Encounter Medications as of 03/06/2018  Medication Sig  . AMBULATORY NON FORMULARY MEDICATION Respironics auto-pap CPAP machine. Dx: V67.20. Fax to [facility]: [phone].  . Ascorbic Acid (VITAMIN C) 1000 MG tablet Take 1,000 mg by mouth daily.  . Cholecalciferol (VITAMIN D) 2000 units CAPS Take 1 capsule by mouth daily.  . cyclobenzaprine (FLEXERIL) 10 MG tablet TAKE 1 TABLET (10 MG TOTAL) BY MOUTH DAILY.  . fish oil-omega-3 fatty acids 1000 MG capsule Take 1 g by mouth daily.    . meloxicam (MOBIC) 15 MG tablet TAKE 1 TABLET BY MOUTH ONCE DAILY  . telmisartan (MICARDIS) 40 MG tablet TAKE 1 TABLET BY MOUTH  ONCE DAILY  . zolpidem (AMBIEN) 10 MG tablet Take 1 tablet (10 mg total) by mouth at bedtime as needed. for sleep   No facility-administered encounter medications on file as of 03/06/2018.          Objective:   Physical Exam  Constitutional: She is oriented to person, place, and time. She appears well-developed and well-nourished.  HENT:  Head: Normocephalic and atraumatic.  Right Ear: External ear normal.  Left Ear: External ear normal.  Nose: Nose normal.  Mouth/Throat: Oropharynx is clear and moist.  Left tympanic membrane only partially visualized because of cerumen.  But what I can see appears normal.  Right TM and canal is clear.  Eyes: Pupils are equal, round, and reactive to light. Conjunctivae and EOM are normal.  Neck: Neck supple. No thyromegaly present.  Cardiovascular: Normal rate, regular rhythm and normal heart sounds.  Pulmonary/Chest: Effort normal and breath sounds normal. She has no wheezes.  Lymphadenopathy:    She has no cervical adenopathy.  Neurological: She is alert and oriented to person, place, and time.  Positive Dix-Hallpike maneuver on the right.  Skin: Skin is warm and dry.  Psychiatric: She has a normal mood and affect.        Assessment & Plan:  + Benign paroxysmal positional vertigo of the right ear-discussed diagnosis.  Handout provided with exercises to do on her own at home.  We also discussed possibly referring her for more formal vestibular rehab.  She wants to try the home exercises first.  If she is not noticing significant improvement by Monday I asked her to call us back so that we can go ahead and place a referral.  Offered meclizine but she is not having any significant nausea with it.

## 2018-03-11 ENCOUNTER — Telehealth: Payer: Self-pay

## 2018-03-11 DIAGNOSIS — H8110 Benign paroxysmal vertigo, unspecified ear: Secondary | ICD-10-CM

## 2018-03-11 NOTE — Telephone Encounter (Signed)
Madi called and requested a referral to [facility] for vertigo. Please advise.

## 2018-03-11 NOTE — Telephone Encounter (Signed)
Referral placed.

## 2018-03-12 NOTE — Telephone Encounter (Signed)
Left message advising of referral.  

## 2018-03-18 DIAGNOSIS — R29898 Other symptoms and signs involving the musculoskeletal system: Secondary | ICD-10-CM | POA: Diagnosis not present

## 2018-03-18 DIAGNOSIS — R42 Dizziness and giddiness: Secondary | ICD-10-CM | POA: Diagnosis not present

## 2018-05-02 DIAGNOSIS — H524 Presbyopia: Secondary | ICD-10-CM | POA: Diagnosis not present

## 2018-05-02 DIAGNOSIS — Z961 Presence of intraocular lens: Secondary | ICD-10-CM | POA: Diagnosis not present

## 2018-05-02 DIAGNOSIS — H26493 Other secondary cataract, bilateral: Secondary | ICD-10-CM | POA: Diagnosis not present

## 2018-05-14 DIAGNOSIS — H26491 Other secondary cataract, right eye: Secondary | ICD-10-CM | POA: Diagnosis not present

## 2018-05-23 DIAGNOSIS — H26492 Other secondary cataract, left eye: Secondary | ICD-10-CM | POA: Diagnosis not present

## 2018-06-25 DIAGNOSIS — L814 Other melanin hyperpigmentation: Secondary | ICD-10-CM | POA: Diagnosis not present

## 2018-06-25 DIAGNOSIS — L570 Actinic keratosis: Secondary | ICD-10-CM | POA: Diagnosis not present

## 2018-06-25 DIAGNOSIS — D485 Neoplasm of uncertain behavior of skin: Secondary | ICD-10-CM | POA: Diagnosis not present

## 2018-06-25 DIAGNOSIS — D225 Melanocytic nevi of trunk: Secondary | ICD-10-CM | POA: Diagnosis not present

## 2018-06-25 DIAGNOSIS — L821 Other seborrheic keratosis: Secondary | ICD-10-CM | POA: Diagnosis not present

## 2018-06-26 DIAGNOSIS — R69 Illness, unspecified: Secondary | ICD-10-CM | POA: Diagnosis not present

## 2018-07-07 ENCOUNTER — Encounter: Payer: Self-pay | Admitting: Family Medicine

## 2018-07-07 ENCOUNTER — Ambulatory Visit (INDEPENDENT_AMBULATORY_CARE_PROVIDER_SITE_OTHER): Payer: Medicare HMO | Admitting: Family Medicine

## 2018-07-07 VITALS — BP 126/78 | HR 56 | Ht 66.0 in | Wt 210.0 lb

## 2018-07-07 DIAGNOSIS — Z1239 Encounter for other screening for malignant neoplasm of breast: Secondary | ICD-10-CM

## 2018-07-07 DIAGNOSIS — E7800 Pure hypercholesterolemia, unspecified: Secondary | ICD-10-CM | POA: Diagnosis not present

## 2018-07-07 DIAGNOSIS — M159 Polyosteoarthritis, unspecified: Secondary | ICD-10-CM

## 2018-07-07 DIAGNOSIS — M8949 Other hypertrophic osteoarthropathy, multiple sites: Secondary | ICD-10-CM

## 2018-07-07 DIAGNOSIS — M150 Primary generalized (osteo)arthritis: Secondary | ICD-10-CM | POA: Diagnosis not present

## 2018-07-07 DIAGNOSIS — Z1159 Encounter for screening for other viral diseases: Secondary | ICD-10-CM

## 2018-07-07 DIAGNOSIS — G4700 Insomnia, unspecified: Secondary | ICD-10-CM

## 2018-07-07 DIAGNOSIS — I10 Essential (primary) hypertension: Secondary | ICD-10-CM | POA: Diagnosis not present

## 2018-07-07 MED ORDER — ZOLPIDEM TARTRATE 10 MG PO TABS: 10.0000 mg | ORAL_TABLET | Freq: Every evening | ORAL | 1 refills | Status: DC | PRN

## 2018-07-07 MED ORDER — MELOXICAM 15 MG PO TABS: 15.0000 mg | ORAL_TABLET | Freq: Every day | ORAL | 1 refills | Status: DC

## 2018-07-07 MED ORDER — TELMISARTAN 40 MG PO TABS: 40.0000 mg | ORAL_TABLET | Freq: Every day | ORAL | 1 refills | Status: DC

## 2018-07-07 NOTE — Progress Notes (Signed)
Subjective:    CC: BP check   HPI: Hypertension- Pt denies chest pain, SOB, dizziness, or heart palpitations.  Taking meds as directed w/o problems.  Denies medication side effects.    F/U Insomnia - still not sleeping well. Goes to bed around 11:30 and falls sleep well but then wakes up around 2AM and often can't go back to sleep.  She takes 10 mg of Ambien nightly which she has been on for quite some time.  Says she is been tracking her sleep a little bit better since she is had her fit bit.  F/U Hyperlipidemia - Not currently on a statin. Taking a fish oil.    Past medical history, Surgical history, Family history not pertinant except as noted below, Social history, Allergies, and medications have been entered into the medical record, reviewed, and corrections made.   Review of Systems: No fevers, chills, night sweats, weight loss, chest pain, or shortness of breath.   Objective:    General: Well Developed, well nourished, and in no acute distress.  Neuro: Alert and oriented x3, extra-ocular muscles intact, sensation grossly intact.  HEENT: Normocephalic, atraumatic  Skin: Warm and dry, no rashes. Cardiac: Regular rate and rhythm, no murmurs rubs or gallops, no lower extremity edema.  Respiratory: Clear to auscultation bilaterally. Not using accessory muscles, speaking in full sentences.   Impression and Recommendations:    HTN - Well controlled. Continue current regimen. Follow up in  6 months.    Insomnia -still not well controlled on the Ambien 10 mg but she is Artie tried a couple other options such as Lunesta.  We discussed maybe trying 1 of the newer options like Belsomra but she would have to do a washout.  Or maybe even using an off label mood stabilizer such as Seroquel.  She says she will think about it and let me know.  Hyperlipidemia - still on fish oil capsules.  Will recheck lipids.

## 2018-07-07 NOTE — Addendum Note (Signed)
Addended by: Beatrice Lecher D on: 07/07/2018 04:33 PM   Modules accepted: Orders

## 2018-07-08 LAB — COMPLETE METABOLIC PANEL WITH GFR
AG Ratio: 2.1 (calc) (ref 1.0–2.5)
ALT: 25 U/L (ref 6–29)
AST: 20 U/L (ref 10–35)
Albumin: 4.4 g/dL (ref 3.6–5.1)
Alkaline phosphatase (APISO): 70 U/L (ref 37–153)
BUN: 16 mg/dL (ref 7–25)
CALCIUM: 10.3 mg/dL (ref 8.6–10.4)
CO2: 27 mmol/L (ref 20–32)
CREATININE: 0.72 mg/dL (ref 0.50–0.99)
Chloride: 107 mmol/L (ref 98–110)
GFR, EST AFRICAN AMERICAN: 101 mL/min/{1.73_m2} (ref >=60)
GFR, EST NON AFRICAN AMERICAN: 87 mL/min/{1.73_m2} (ref >=60)
Globulin: 2.1 g/dL (calc) (ref 1.9–3.7)
Glucose, Bld: 97 mg/dL (ref 65–99)
POTASSIUM: 4.6 mmol/L (ref 3.5–5.3)
Sodium: 142 mmol/L (ref 135–146)
TOTAL PROTEIN: 6.5 g/dL (ref 6.1–8.1)
Total Bilirubin: 0.7 mg/dL (ref 0.2–1.2)

## 2018-07-08 LAB — LIPID PANEL
Cholesterol: 253 mg/dL — ABNORMAL HIGH (ref <200)
HDL: 45 mg/dL — ABNORMAL LOW (ref >=50)
LDL Cholesterol (Calc): 172 mg/dL (calc) — ABNORMAL HIGH
Non-HDL Cholesterol (Calc): 208 mg/dL (calc) — ABNORMAL HIGH (ref <130)
Total CHOL/HDL Ratio: 5.6 (calc) — ABNORMAL HIGH (ref <5.0)
Triglycerides: 200 mg/dL — ABNORMAL HIGH (ref <150)

## 2018-07-08 LAB — HEPATITIS C ANTIBODY
Hepatitis C Ab: NONREACTIVE
SIGNAL TO CUT-OFF: 0.01 (ref <1.00)

## 2018-07-09 ENCOUNTER — Encounter: Payer: Self-pay | Admitting: Family Medicine

## 2018-07-09 DIAGNOSIS — E782 Mixed hyperlipidemia: Secondary | ICD-10-CM

## 2018-07-09 MED ORDER — ATORVASTATIN CALCIUM 20 MG PO TABS: 20.0000 mg | ORAL_TABLET | Freq: Every day | ORAL | 3 refills | Status: AC

## 2018-07-09 NOTE — Telephone Encounter (Signed)
Med sent. Recheck lipids and liver in 8 weeks, fasting. Let pt know

## 2018-07-09 NOTE — Telephone Encounter (Signed)
Left message advising of recommendations.  

## 2018-09-23 DIAGNOSIS — E782 Mixed hyperlipidemia: Secondary | ICD-10-CM | POA: Diagnosis not present

## 2018-09-24 LAB — LIPID PANEL W/REFLEX DIRECT LDL
Cholesterol: 143 mg/dL (ref <200)
HDL: 53 mg/dL (ref >=50)
LDL Cholesterol (Calc): 68 mg/dL (calc)
NON-HDL CHOLESTEROL (CALC): 90 mg/dL (ref <130)
Total CHOL/HDL Ratio: 2.7 (calc) (ref <5.0)
Triglycerides: 134 mg/dL (ref <150)

## 2018-09-24 LAB — COMPLETE METABOLIC PANEL WITH GFR
AG RATIO: 2.3 (calc) (ref 1.0–2.5)
ALT: 25 U/L (ref 6–29)
AST: 21 U/L (ref 10–35)
Albumin: 4.4 g/dL (ref 3.6–5.1)
Alkaline phosphatase (APISO): 59 U/L (ref 37–153)
BUN: 17 mg/dL (ref 7–25)
CO2: 28 mmol/L (ref 20–32)
Calcium: 10.2 mg/dL (ref 8.6–10.4)
Chloride: 106 mmol/L (ref 98–110)
Creat: 0.66 mg/dL (ref 0.50–0.99)
GFR, Est African American: 107 mL/min/{1.73_m2} (ref >=60)
GFR, Est Non African American: 92 mL/min/{1.73_m2} (ref >=60)
Globulin: 1.9 g/dL (calc) (ref 1.9–3.7)
Glucose, Bld: 95 mg/dL (ref 65–99)
Potassium: 4.5 mmol/L (ref 3.5–5.3)
Sodium: 141 mmol/L (ref 135–146)
Total Bilirubin: 1 mg/dL (ref 0.2–1.2)
Total Protein: 6.3 g/dL (ref 6.1–8.1)

## 2018-11-10 DIAGNOSIS — Z1231 Encounter for screening mammogram for malignant neoplasm of breast: Secondary | ICD-10-CM | POA: Diagnosis not present

## 2018-11-10 LAB — HM MAMMOGRAPHY

## 2018-11-27 ENCOUNTER — Encounter: Payer: Self-pay | Admitting: Family Medicine

## 2019-01-05 DIAGNOSIS — R69 Illness, unspecified: Secondary | ICD-10-CM | POA: Diagnosis not present

## 2019-01-06 ENCOUNTER — Telehealth: Payer: Self-pay | Admitting: Family Medicine

## 2019-01-06 DIAGNOSIS — G4700 Insomnia, unspecified: Secondary | ICD-10-CM

## 2019-01-06 MED ORDER — ZOLPIDEM TARTRATE 10 MG PO TABS: 10.0000 mg | ORAL_TABLET | Freq: Every evening | ORAL | 0 refills | Status: DC | PRN

## 2019-01-06 NOTE — Telephone Encounter (Signed)
Patient states that she will be out of zolpidem (AMBIEN) 10 MG tablet [[id number]]  On 08/18. Would like a refill on this.

## 2019-01-06 NOTE — Addendum Note (Signed)
Addended by: Beatrice Lecher D on: 01/06/2019 12:22 PM   Modules accepted: Orders

## 2019-01-06 NOTE — Telephone Encounter (Signed)
Please see providers note and call to schedule

## 2019-01-06 NOTE — Telephone Encounter (Signed)
Please call patient and let her know that she is due to schedule a six-month follow-up appointment that is why she is running out of the medication.  I did go ahead and refill her medicine so she does not run out but she does need to schedule follow-up appointment.  I am happy to do a virtual appointment with her especially if she is able to check her blood pressure on her own at home.

## 2019-01-06 NOTE — Telephone Encounter (Signed)
Appointment has been set up for Thursday via virtual. Patient states she has video access and she has a way to check her blood pressure at home. No further questions at this time.

## 2019-01-08 ENCOUNTER — Ambulatory Visit (INDEPENDENT_AMBULATORY_CARE_PROVIDER_SITE_OTHER): Payer: Medicare HMO | Admitting: Family Medicine

## 2019-01-08 ENCOUNTER — Ambulatory Visit: Payer: Medicare HMO | Admitting: Family Medicine

## 2019-01-08 ENCOUNTER — Encounter: Payer: Self-pay | Admitting: Family Medicine

## 2019-01-08 ENCOUNTER — Other Ambulatory Visit: Payer: Self-pay | Admitting: Family Medicine

## 2019-01-08 VITALS — BP 116/79 | Temp 98.2°F | Ht 66.0 in | Wt 206.2 lb

## 2019-01-08 DIAGNOSIS — M159 Polyosteoarthritis, unspecified: Secondary | ICD-10-CM

## 2019-01-08 DIAGNOSIS — I10 Essential (primary) hypertension: Secondary | ICD-10-CM

## 2019-01-08 DIAGNOSIS — G4700 Insomnia, unspecified: Secondary | ICD-10-CM

## 2019-01-08 DIAGNOSIS — M150 Primary generalized (osteo)arthritis: Secondary | ICD-10-CM

## 2019-01-08 DIAGNOSIS — G4733 Obstructive sleep apnea (adult) (pediatric): Secondary | ICD-10-CM

## 2019-01-08 DIAGNOSIS — M8949 Other hypertrophic osteoarthropathy, multiple sites: Secondary | ICD-10-CM

## 2019-01-08 MED ORDER — MELOXICAM 15 MG PO TABS: 15.0000 mg | ORAL_TABLET | Freq: Every day | ORAL | 1 refills | Status: AC

## 2019-01-08 MED ORDER — ZOLPIDEM TARTRATE 10 MG PO TABS: 10.0000 mg | ORAL_TABLET | Freq: Every evening | ORAL | 0 refills | Status: DC | PRN

## 2019-01-08 NOTE — Progress Notes (Signed)
Pt reports that her Ambien was sent to [facility] and she normally gets this from [facility]. She did not p/u the prescription from [facility]. I called [facility] pharmacy but they are on lunch until 2pm to have this cancelled.Maryruth Eve, Lahoma Crocker, CMA

## 2019-01-08 NOTE — Progress Notes (Signed)
Virtual Visit via Video Note  I connected with Mariah Beck on 01/08/19 at  2:00 PM EDT by a video enabled telemedicine application and verified that I am speaking with the correct person using two identifiers.   I discussed the limitations of evaluation and management by telemedicine and the availability of in person appointments. The patient expressed understanding and agreed to proceed.  Pt was at home and I was in my office for the virtual visit.       Established Patient Office Visit  Subjective:  Patient ID: Mariah Beck, female    DOB: February 27, 1951  Age: 68 y.o. MRN: [id number]  CC:  Chief Complaint  Patient presents with  . Hypertension  . Insomnia    HPI Mariah Beck presents for   Hypertension- Pt denies chest pain, SOB, dizziness, or heart palpitations.  Taking meds as directed w/o problems.  Denies medication side effects.  Staying active playing pickle ball.    F/U insomnia - doing OK on the AMbien. Some nights still can't sleep well.  Would like a refill.   OA - would like a refill Mobic.  She is doing OK  Has been staying home with COVID bc of her husbands health problem, heart and lungs.   Past Medical History:  Diagnosis Date  . Arthritis   . CAD (coronary artery disease)    mild  . Depression   . Hypertension   . Lumbar spondylosis   . Obesity   . Plantar fasciitis   . Reactive airways dysfunction syndrome [facility])     Past Surgical History:  Procedure Laterality Date  . APPENDECTOMY    . CARDIAC CATHETERIZATION    . D & C with hysteroscopy    . LT orif    . LTCS    . tonsillectomy and adnoidectomy      Family History  Problem Relation Age of Onset  . Cancer Father   . Diabetes Father   . Hypertension Father   . Gout Father   . Dementia Mother 55  . Cancer Brother   . Alcohol abuse Brother     Social History   Socioeconomic History  . Marital status: Married    Spouse name: Not on file  . Number of children: Not on file  .  Years of education: Not on file  . Highest education level: Not on file  Occupational History  . Not on file  Social Needs  . Financial resource strain: Not on file  . Food insecurity    Worry: Not on file    Inability: Not on file  . Transportation needs    Medical: Not on file    Non-medical: Not on file  Tobacco Use  . Smoking status: Never Smoker  . Smokeless tobacco: Never Used  Substance and Sexual Activity  . Alcohol use: Yes    Comment: occasiona'  . Drug use: Not on file  . Sexual activity: Not on file  Lifestyle  . Physical activity    Days per week: Not on file    Minutes per session: Not on file  . Stress: Not on file  Relationships  . Social Herbalist on phone: Not on file    Gets together: Not on file    Attends religious service: Not on file    Active member of club or organization: Not on file    Attends meetings of clubs or organizations: Not on file    Relationship status:  Not on file  . Intimate partner violence    Fear of current or ex partner: Not on file    Emotionally abused: Not on file    Physically abused: Not on file    Forced sexual activity: Not on file  Other Topics Concern  . Not on file  Social History Narrative  . Not on file    Outpatient Medications Prior to Visit  Medication Sig Dispense Refill  . AMBULATORY NON FORMULARY MEDICATION Respironics auto-pap CPAP machine. Dx: J82.50. Fax to [facility]: [phone]. 1 each 0  . Ascorbic Acid (VITAMIN C) 1000 MG tablet Take 1,000 mg by mouth daily.    Marland Kitchen atorvastatin (LIPITOR) 20 MG tablet Take 1 tablet (20 mg total) by mouth daily. 90 tablet 3  . Cholecalciferol (VITAMIN D) 2000 units CAPS Take 1 capsule by mouth daily.    . cyclobenzaprine (FLEXERIL) 10 MG tablet TAKE 1 TABLET (10 MG TOTAL) BY MOUTH DAILY. 30 tablet 0  . fish oil-omega-3 fatty acids 1000 MG capsule Take 1 g by mouth daily.      Marland Kitchen telmisartan (MICARDIS) 40 MG tablet Take 1 tablet by mouth once daily 90 tablet 1   . meloxicam (MOBIC) 15 MG tablet Take 1 tablet (15 mg total) by mouth daily. 90 tablet 1  . zolpidem (AMBIEN) 10 MG tablet Take 1 tablet (10 mg total) by mouth at bedtime as needed. for sleep 90 tablet 0   No facility-administered medications prior to visit.     Allergies  Allergen Reactions  . Acyclovir And Related   . Eszopiclone     REACTION: hauulcinations  . Levofloxacin     Other reaction(s): Other (See Comments) Body aches Body aches    ROS Review of Systems    Objective:    Physical Exam  BP 116/79   Temp 98.2 F (36.8 C)   Ht 5\' 6"  (1.676 m)   Wt 206 lb 3.2 oz (93.5 kg)   BMI 33.28 kg/m  Wt Readings from Last 3 Encounters:  01/08/19 206 lb 3.2 oz (93.5 kg)  07/07/18 210 lb (95.3 kg)  03/06/18 201 lb (91.2 kg)     Health Maintenance Due  Topic Date Due  . DEXA SCAN  11/18/2016    There are no preventive care reminders to display for this patient.  Lab Results  Component Value Date   TSH 1.091 09/20/2011   Lab Results  Component Value Date   WBC 4.9 07/19/2017   HGB 13.9 07/19/2017   HCT 40.8 07/19/2017   MCV 86.8 07/19/2017   PLT 174 07/19/2017   Lab Results  Component Value Date   NA 141 09/23/2018   K 4.5 09/23/2018   CO2 28 09/23/2018   GLUCOSE 95 09/23/2018   BUN 17 09/23/2018   CREATININE 0.66 09/23/2018   BILITOT 1.0 09/23/2018   ALKPHOS 61 03/05/2016   AST 21 09/23/2018   ALT 25 09/23/2018   PROT 6.3 09/23/2018   ALBUMIN 4.3 03/05/2016   CALCIUM 10.2 09/23/2018   Lab Results  Component Value Date   CHOL 143 09/23/2018   Lab Results  Component Value Date   HDL 53 09/23/2018   Lab Results  Component Value Date   LDLCALC 68 09/23/2018   Lab Results  Component Value Date   TRIG 134 09/23/2018   Lab Results  Component Value Date   CHOLHDL 2.7 09/23/2018   Lab Results  Component Value Date   HGBA1C  03/17/2010    5.4 (NOTE)  According to the  ADA Clinical Practice Recommendations for 2011, when HbA1c is used as a screening test:   >=6.5%   Diagnostic of Diabetes Mellitus           (if abnormal result  is confirmed)  5.7-6.4%   Increased risk of developing Diabetes Mellitus  References:Diagnosis and Classification of Diabetes Mellitus,Diabetes WVPX,1062,69(SWNIO 1):S62-S69 and Standards of Medical Care in         Diabetes - 2011,Diabetes EVOJ,5009,38  (Suppl 1):S11-S61.      Assessment & Plan:   Problem List Items Addressed This Visit      Respiratory   DSORD, ORGANIC OBSTRUCTIVE SLEEP APNEA     Musculoskeletal and Integument   Osteoarthritis - Primary   Relevant Medications   meloxicam (MOBIC) 15 MG tablet     Other   Insomnia   Relevant Medications   zolpidem (AMBIEN) 10 MG tablet      Meds ordered this encounter  Medications  . meloxicam (MOBIC) 15 MG tablet    Sig: Take 1 tablet (15 mg total) by mouth daily.    Dispense:  90 tablet    Refill:  1  . zolpidem (AMBIEN) 10 MG tablet    Sig: Take 1 tablet (10 mg total) by mouth at bedtime as needed. for sleep. PLEASE SEND TO [facility] ONLY    Dispense:  90 tablet    Refill:  0    Follow-up: Return in about 6 months (around 07/11/2019) for Hypertension.    I discussed the assessment and treatment plan with the patient. The patient was provided an opportunity to ask questions and all were answered. The patient agreed with the plan and demonstrated an understanding of the instructions.   The patient was advised to call back or seek an in-person evaluation if the symptoms worsen or if the condition fails to improve as anticipated.  Beatrice Lecher, MD

## 2019-01-22 DIAGNOSIS — R69 Illness, unspecified: Secondary | ICD-10-CM | POA: Diagnosis not present

## 2019-02-10 DIAGNOSIS — S90852A Superficial foreign body, left foot, initial encounter: Secondary | ICD-10-CM | POA: Diagnosis not present

## 2019-02-10 DIAGNOSIS — R030 Elevated blood-pressure reading, without diagnosis of hypertension: Secondary | ICD-10-CM | POA: Diagnosis not present

## 2019-03-17 DIAGNOSIS — M79645 Pain in left finger(s): Secondary | ICD-10-CM | POA: Diagnosis not present

## 2019-03-17 DIAGNOSIS — M180 Bilateral primary osteoarthritis of first carpometacarpal joints: Secondary | ICD-10-CM | POA: Diagnosis not present

## 2019-03-17 DIAGNOSIS — M67432 Ganglion, left wrist: Secondary | ICD-10-CM | POA: Diagnosis not present

## 2019-03-17 DIAGNOSIS — R52 Pain, unspecified: Secondary | ICD-10-CM | POA: Diagnosis not present

## 2019-03-17 DIAGNOSIS — G8929 Other chronic pain: Secondary | ICD-10-CM | POA: Diagnosis not present

## 2019-03-17 DIAGNOSIS — M1812 Unilateral primary osteoarthritis of first carpometacarpal joint, left hand: Secondary | ICD-10-CM | POA: Diagnosis not present

## 2019-03-19 DIAGNOSIS — M180 Bilateral primary osteoarthritis of first carpometacarpal joints: Secondary | ICD-10-CM | POA: Diagnosis not present

## 2019-03-19 DIAGNOSIS — M67432 Ganglion, left wrist: Secondary | ICD-10-CM | POA: Diagnosis not present

## 2019-04-01 DIAGNOSIS — M79642 Pain in left hand: Secondary | ICD-10-CM | POA: Diagnosis not present

## 2019-04-09 ENCOUNTER — Other Ambulatory Visit: Payer: Self-pay | Admitting: Family Medicine

## 2019-04-09 DIAGNOSIS — G4700 Insomnia, unspecified: Secondary | ICD-10-CM

## 2019-04-17 DIAGNOSIS — M79642 Pain in left hand: Secondary | ICD-10-CM | POA: Diagnosis not present

## 2019-06-01 DIAGNOSIS — H52203 Unspecified astigmatism, bilateral: Secondary | ICD-10-CM | POA: Diagnosis not present

## 2019-06-01 DIAGNOSIS — H524 Presbyopia: Secondary | ICD-10-CM | POA: Diagnosis not present

## 2019-06-01 DIAGNOSIS — H5211 Myopia, right eye: Secondary | ICD-10-CM | POA: Diagnosis not present

## 2019-06-15 DIAGNOSIS — M2241 Chondromalacia patellae, right knee: Secondary | ICD-10-CM | POA: Diagnosis not present

## 2019-06-15 DIAGNOSIS — R52 Pain, unspecified: Secondary | ICD-10-CM | POA: Diagnosis not present

## 2019-06-15 DIAGNOSIS — M7062 Trochanteric bursitis, left hip: Secondary | ICD-10-CM | POA: Diagnosis not present

## 2019-06-15 DIAGNOSIS — M7061 Trochanteric bursitis, right hip: Secondary | ICD-10-CM | POA: Diagnosis not present

## 2019-07-02 DIAGNOSIS — E782 Mixed hyperlipidemia: Secondary | ICD-10-CM | POA: Diagnosis not present

## 2019-07-02 DIAGNOSIS — I10 Essential (primary) hypertension: Secondary | ICD-10-CM | POA: Diagnosis not present

## 2019-07-02 DIAGNOSIS — R1013 Epigastric pain: Secondary | ICD-10-CM | POA: Diagnosis not present

## 2019-07-02 DIAGNOSIS — Z7689 Persons encountering health services in other specified circumstances: Secondary | ICD-10-CM | POA: Diagnosis not present

## 2019-07-02 DIAGNOSIS — M8949 Other hypertrophic osteoarthropathy, multiple sites: Secondary | ICD-10-CM | POA: Diagnosis not present

## 2019-07-02 DIAGNOSIS — Z800 Family history of malignant neoplasm of digestive organs: Secondary | ICD-10-CM | POA: Diagnosis not present

## 2019-07-02 DIAGNOSIS — R69 Illness, unspecified: Secondary | ICD-10-CM | POA: Diagnosis not present

## 2019-07-07 DIAGNOSIS — K8020 Calculus of gallbladder without cholecystitis without obstruction: Secondary | ICD-10-CM | POA: Diagnosis not present

## 2019-07-07 DIAGNOSIS — Z800 Family history of malignant neoplasm of digestive organs: Secondary | ICD-10-CM | POA: Diagnosis not present

## 2019-07-07 DIAGNOSIS — K760 Fatty (change of) liver, not elsewhere classified: Secondary | ICD-10-CM | POA: Diagnosis not present

## 2019-07-09 DIAGNOSIS — R69 Illness, unspecified: Secondary | ICD-10-CM | POA: Diagnosis not present

## 2019-08-19 DIAGNOSIS — Z800 Family history of malignant neoplasm of digestive organs: Secondary | ICD-10-CM | POA: Diagnosis not present

## 2019-08-19 DIAGNOSIS — K760 Fatty (change of) liver, not elsewhere classified: Secondary | ICD-10-CM | POA: Diagnosis not present

## 2019-08-19 DIAGNOSIS — K5909 Other constipation: Secondary | ICD-10-CM | POA: Diagnosis not present

## 2019-08-19 DIAGNOSIS — R112 Nausea with vomiting, unspecified: Secondary | ICD-10-CM | POA: Diagnosis not present

## 2019-08-19 DIAGNOSIS — R1013 Epigastric pain: Secondary | ICD-10-CM | POA: Diagnosis not present

## 2019-08-19 DIAGNOSIS — R6881 Early satiety: Secondary | ICD-10-CM | POA: Diagnosis not present

## 2019-08-24 DIAGNOSIS — M7061 Trochanteric bursitis, right hip: Secondary | ICD-10-CM | POA: Diagnosis not present

## 2019-08-24 DIAGNOSIS — M2550 Pain in unspecified joint: Secondary | ICD-10-CM | POA: Diagnosis not present

## 2019-08-24 DIAGNOSIS — M545 Low back pain: Secondary | ICD-10-CM | POA: Diagnosis not present

## 2019-08-24 DIAGNOSIS — M7062 Trochanteric bursitis, left hip: Secondary | ICD-10-CM | POA: Diagnosis not present

## 2019-08-24 DIAGNOSIS — L570 Actinic keratosis: Secondary | ICD-10-CM | POA: Diagnosis not present

## 2019-08-24 DIAGNOSIS — L821 Other seborrheic keratosis: Secondary | ICD-10-CM | POA: Diagnosis not present

## 2019-08-24 DIAGNOSIS — D225 Melanocytic nevi of trunk: Secondary | ICD-10-CM | POA: Diagnosis not present

## 2019-09-01 DIAGNOSIS — M7061 Trochanteric bursitis, right hip: Secondary | ICD-10-CM | POA: Diagnosis not present

## 2019-09-01 DIAGNOSIS — M5136 Other intervertebral disc degeneration, lumbar region: Secondary | ICD-10-CM | POA: Diagnosis not present

## 2019-09-01 DIAGNOSIS — M2550 Pain in unspecified joint: Secondary | ICD-10-CM | POA: Diagnosis not present

## 2019-09-01 DIAGNOSIS — M5137 Other intervertebral disc degeneration, lumbosacral region: Secondary | ICD-10-CM | POA: Diagnosis not present

## 2019-09-01 DIAGNOSIS — M545 Low back pain: Secondary | ICD-10-CM | POA: Diagnosis not present

## 2019-09-01 DIAGNOSIS — M47816 Spondylosis without myelopathy or radiculopathy, lumbar region: Secondary | ICD-10-CM | POA: Diagnosis not present

## 2019-09-01 DIAGNOSIS — M47817 Spondylosis without myelopathy or radiculopathy, lumbosacral region: Secondary | ICD-10-CM | POA: Diagnosis not present

## 2019-09-01 DIAGNOSIS — M7062 Trochanteric bursitis, left hip: Secondary | ICD-10-CM | POA: Diagnosis not present

## 2019-09-09 DIAGNOSIS — M5136 Other intervertebral disc degeneration, lumbar region: Secondary | ICD-10-CM | POA: Diagnosis not present

## 2019-09-09 DIAGNOSIS — M48061 Spinal stenosis, lumbar region without neurogenic claudication: Secondary | ICD-10-CM | POA: Diagnosis not present

## 2019-09-09 DIAGNOSIS — G894 Chronic pain syndrome: Secondary | ICD-10-CM | POA: Diagnosis not present

## 2019-09-09 DIAGNOSIS — M4726 Other spondylosis with radiculopathy, lumbar region: Secondary | ICD-10-CM | POA: Diagnosis not present

## 2019-09-09 DIAGNOSIS — M545 Low back pain: Secondary | ICD-10-CM | POA: Diagnosis not present

## 2019-09-17 DIAGNOSIS — M5136 Other intervertebral disc degeneration, lumbar region: Secondary | ICD-10-CM | POA: Diagnosis not present

## 2019-09-21 DIAGNOSIS — M5136 Other intervertebral disc degeneration, lumbar region: Secondary | ICD-10-CM | POA: Diagnosis not present

## 2019-09-23 DIAGNOSIS — M5136 Other intervertebral disc degeneration, lumbar region: Secondary | ICD-10-CM | POA: Diagnosis not present

## 2019-09-25 DIAGNOSIS — M2241 Chondromalacia patellae, right knee: Secondary | ICD-10-CM | POA: Diagnosis not present

## 2019-09-25 DIAGNOSIS — M5136 Other intervertebral disc degeneration, lumbar region: Secondary | ICD-10-CM | POA: Diagnosis not present

## 2019-10-07 DIAGNOSIS — M6281 Muscle weakness (generalized): Secondary | ICD-10-CM | POA: Diagnosis not present

## 2019-10-07 DIAGNOSIS — M79606 Pain in leg, unspecified: Secondary | ICD-10-CM | POA: Diagnosis not present

## 2019-10-07 DIAGNOSIS — R2689 Other abnormalities of gait and mobility: Secondary | ICD-10-CM | POA: Diagnosis not present

## 2019-10-07 DIAGNOSIS — M5136 Other intervertebral disc degeneration, lumbar region: Secondary | ICD-10-CM | POA: Diagnosis not present

## 2019-10-07 DIAGNOSIS — M4800 Spinal stenosis, site unspecified: Secondary | ICD-10-CM | POA: Diagnosis not present

## 2019-10-07 DIAGNOSIS — M545 Low back pain: Secondary | ICD-10-CM | POA: Diagnosis not present

## 2019-10-09 DIAGNOSIS — M5136 Other intervertebral disc degeneration, lumbar region: Secondary | ICD-10-CM | POA: Diagnosis not present

## 2019-10-09 DIAGNOSIS — M6281 Muscle weakness (generalized): Secondary | ICD-10-CM | POA: Diagnosis not present

## 2019-10-09 DIAGNOSIS — M545 Low back pain: Secondary | ICD-10-CM | POA: Diagnosis not present

## 2019-10-09 DIAGNOSIS — M4800 Spinal stenosis, site unspecified: Secondary | ICD-10-CM | POA: Diagnosis not present

## 2019-10-09 DIAGNOSIS — R2689 Other abnormalities of gait and mobility: Secondary | ICD-10-CM | POA: Diagnosis not present

## 2019-10-09 DIAGNOSIS — M79606 Pain in leg, unspecified: Secondary | ICD-10-CM | POA: Diagnosis not present

## 2019-10-12 DIAGNOSIS — M545 Low back pain: Secondary | ICD-10-CM | POA: Diagnosis not present

## 2019-10-12 DIAGNOSIS — R2689 Other abnormalities of gait and mobility: Secondary | ICD-10-CM | POA: Diagnosis not present

## 2019-10-12 DIAGNOSIS — M6281 Muscle weakness (generalized): Secondary | ICD-10-CM | POA: Diagnosis not present

## 2019-10-12 DIAGNOSIS — M79606 Pain in leg, unspecified: Secondary | ICD-10-CM | POA: Diagnosis not present

## 2019-10-12 DIAGNOSIS — M5136 Other intervertebral disc degeneration, lumbar region: Secondary | ICD-10-CM | POA: Diagnosis not present

## 2019-10-12 DIAGNOSIS — M4800 Spinal stenosis, site unspecified: Secondary | ICD-10-CM | POA: Diagnosis not present

## 2019-10-14 DIAGNOSIS — M4800 Spinal stenosis, site unspecified: Secondary | ICD-10-CM | POA: Diagnosis not present

## 2019-10-14 DIAGNOSIS — M5136 Other intervertebral disc degeneration, lumbar region: Secondary | ICD-10-CM | POA: Diagnosis not present

## 2019-10-14 DIAGNOSIS — M545 Low back pain: Secondary | ICD-10-CM | POA: Diagnosis not present

## 2019-10-14 DIAGNOSIS — M79606 Pain in leg, unspecified: Secondary | ICD-10-CM | POA: Diagnosis not present

## 2019-10-14 DIAGNOSIS — M6281 Muscle weakness (generalized): Secondary | ICD-10-CM | POA: Diagnosis not present

## 2019-10-14 DIAGNOSIS — R2689 Other abnormalities of gait and mobility: Secondary | ICD-10-CM | POA: Diagnosis not present

## 2019-10-16 DIAGNOSIS — M4800 Spinal stenosis, site unspecified: Secondary | ICD-10-CM | POA: Diagnosis not present

## 2019-10-16 DIAGNOSIS — M545 Low back pain: Secondary | ICD-10-CM | POA: Diagnosis not present

## 2019-10-16 DIAGNOSIS — M5136 Other intervertebral disc degeneration, lumbar region: Secondary | ICD-10-CM | POA: Diagnosis not present

## 2019-10-16 DIAGNOSIS — M79606 Pain in leg, unspecified: Secondary | ICD-10-CM | POA: Diagnosis not present

## 2019-10-16 DIAGNOSIS — R2689 Other abnormalities of gait and mobility: Secondary | ICD-10-CM | POA: Diagnosis not present

## 2019-10-16 DIAGNOSIS — M6281 Muscle weakness (generalized): Secondary | ICD-10-CM | POA: Diagnosis not present

## 2019-10-19 DIAGNOSIS — M79606 Pain in leg, unspecified: Secondary | ICD-10-CM | POA: Diagnosis not present

## 2019-10-19 DIAGNOSIS — M545 Low back pain: Secondary | ICD-10-CM | POA: Diagnosis not present

## 2019-10-19 DIAGNOSIS — M5136 Other intervertebral disc degeneration, lumbar region: Secondary | ICD-10-CM | POA: Diagnosis not present

## 2019-10-19 DIAGNOSIS — M6281 Muscle weakness (generalized): Secondary | ICD-10-CM | POA: Diagnosis not present

## 2019-10-19 DIAGNOSIS — M4800 Spinal stenosis, site unspecified: Secondary | ICD-10-CM | POA: Diagnosis not present

## 2019-10-19 DIAGNOSIS — R2689 Other abnormalities of gait and mobility: Secondary | ICD-10-CM | POA: Diagnosis not present

## 2019-10-21 DIAGNOSIS — M79606 Pain in leg, unspecified: Secondary | ICD-10-CM | POA: Diagnosis not present

## 2019-10-21 DIAGNOSIS — R2689 Other abnormalities of gait and mobility: Secondary | ICD-10-CM | POA: Diagnosis not present

## 2019-10-21 DIAGNOSIS — M545 Low back pain: Secondary | ICD-10-CM | POA: Diagnosis not present

## 2019-10-21 DIAGNOSIS — M5136 Other intervertebral disc degeneration, lumbar region: Secondary | ICD-10-CM | POA: Diagnosis not present

## 2019-10-21 DIAGNOSIS — M4800 Spinal stenosis, site unspecified: Secondary | ICD-10-CM | POA: Diagnosis not present

## 2019-10-21 DIAGNOSIS — M6281 Muscle weakness (generalized): Secondary | ICD-10-CM | POA: Diagnosis not present

## 2019-10-23 DIAGNOSIS — M4800 Spinal stenosis, site unspecified: Secondary | ICD-10-CM | POA: Diagnosis not present

## 2019-10-23 DIAGNOSIS — R2689 Other abnormalities of gait and mobility: Secondary | ICD-10-CM | POA: Diagnosis not present

## 2019-10-23 DIAGNOSIS — M5136 Other intervertebral disc degeneration, lumbar region: Secondary | ICD-10-CM | POA: Diagnosis not present

## 2019-10-23 DIAGNOSIS — M545 Low back pain: Secondary | ICD-10-CM | POA: Diagnosis not present

## 2019-10-23 DIAGNOSIS — M79606 Pain in leg, unspecified: Secondary | ICD-10-CM | POA: Diagnosis not present

## 2019-10-23 DIAGNOSIS — M6281 Muscle weakness (generalized): Secondary | ICD-10-CM | POA: Diagnosis not present

## 2019-10-23 DIAGNOSIS — M47816 Spondylosis without myelopathy or radiculopathy, lumbar region: Secondary | ICD-10-CM | POA: Diagnosis not present

## 2019-10-27 DIAGNOSIS — M6281 Muscle weakness (generalized): Secondary | ICD-10-CM | POA: Diagnosis not present

## 2019-10-27 DIAGNOSIS — R2689 Other abnormalities of gait and mobility: Secondary | ICD-10-CM | POA: Diagnosis not present

## 2019-10-27 DIAGNOSIS — M5136 Other intervertebral disc degeneration, lumbar region: Secondary | ICD-10-CM | POA: Diagnosis not present

## 2019-10-27 DIAGNOSIS — M545 Low back pain: Secondary | ICD-10-CM | POA: Diagnosis not present

## 2019-10-30 DIAGNOSIS — M5136 Other intervertebral disc degeneration, lumbar region: Secondary | ICD-10-CM | POA: Diagnosis not present

## 2019-10-30 DIAGNOSIS — M545 Low back pain: Secondary | ICD-10-CM | POA: Diagnosis not present

## 2019-10-30 DIAGNOSIS — R2689 Other abnormalities of gait and mobility: Secondary | ICD-10-CM | POA: Diagnosis not present

## 2019-10-30 DIAGNOSIS — M6281 Muscle weakness (generalized): Secondary | ICD-10-CM | POA: Diagnosis not present

## 2019-11-03 DIAGNOSIS — M5136 Other intervertebral disc degeneration, lumbar region: Secondary | ICD-10-CM | POA: Diagnosis not present

## 2019-11-03 DIAGNOSIS — M6281 Muscle weakness (generalized): Secondary | ICD-10-CM | POA: Diagnosis not present

## 2019-11-03 DIAGNOSIS — R2689 Other abnormalities of gait and mobility: Secondary | ICD-10-CM | POA: Diagnosis not present

## 2019-11-03 DIAGNOSIS — M545 Low back pain: Secondary | ICD-10-CM | POA: Diagnosis not present

## 2019-11-06 DIAGNOSIS — R2689 Other abnormalities of gait and mobility: Secondary | ICD-10-CM | POA: Diagnosis not present

## 2019-11-06 DIAGNOSIS — M545 Low back pain: Secondary | ICD-10-CM | POA: Diagnosis not present

## 2019-11-06 DIAGNOSIS — M5136 Other intervertebral disc degeneration, lumbar region: Secondary | ICD-10-CM | POA: Diagnosis not present

## 2019-11-06 DIAGNOSIS — M6281 Muscle weakness (generalized): Secondary | ICD-10-CM | POA: Diagnosis not present

## 2019-11-10 DIAGNOSIS — M5136 Other intervertebral disc degeneration, lumbar region: Secondary | ICD-10-CM | POA: Diagnosis not present

## 2019-11-10 DIAGNOSIS — R2689 Other abnormalities of gait and mobility: Secondary | ICD-10-CM | POA: Diagnosis not present

## 2019-11-10 DIAGNOSIS — M545 Low back pain: Secondary | ICD-10-CM | POA: Diagnosis not present

## 2019-11-10 DIAGNOSIS — M6281 Muscle weakness (generalized): Secondary | ICD-10-CM | POA: Diagnosis not present

## 2019-11-13 DIAGNOSIS — M6281 Muscle weakness (generalized): Secondary | ICD-10-CM | POA: Diagnosis not present

## 2019-11-13 DIAGNOSIS — R2689 Other abnormalities of gait and mobility: Secondary | ICD-10-CM | POA: Diagnosis not present

## 2019-11-13 DIAGNOSIS — M545 Low back pain: Secondary | ICD-10-CM | POA: Diagnosis not present

## 2019-11-13 DIAGNOSIS — M5136 Other intervertebral disc degeneration, lumbar region: Secondary | ICD-10-CM | POA: Diagnosis not present

## 2019-11-17 DIAGNOSIS — M545 Low back pain: Secondary | ICD-10-CM | POA: Diagnosis not present

## 2019-11-17 DIAGNOSIS — M5136 Other intervertebral disc degeneration, lumbar region: Secondary | ICD-10-CM | POA: Diagnosis not present

## 2019-11-17 DIAGNOSIS — R2689 Other abnormalities of gait and mobility: Secondary | ICD-10-CM | POA: Diagnosis not present

## 2019-11-17 DIAGNOSIS — M6281 Muscle weakness (generalized): Secondary | ICD-10-CM | POA: Diagnosis not present

## 2019-11-20 DIAGNOSIS — M545 Low back pain: Secondary | ICD-10-CM | POA: Diagnosis not present

## 2019-11-20 DIAGNOSIS — R2689 Other abnormalities of gait and mobility: Secondary | ICD-10-CM | POA: Diagnosis not present

## 2019-11-20 DIAGNOSIS — M6281 Muscle weakness (generalized): Secondary | ICD-10-CM | POA: Diagnosis not present

## 2019-11-20 DIAGNOSIS — M5136 Other intervertebral disc degeneration, lumbar region: Secondary | ICD-10-CM | POA: Diagnosis not present

## 2019-11-24 DIAGNOSIS — M5136 Other intervertebral disc degeneration, lumbar region: Secondary | ICD-10-CM | POA: Diagnosis not present

## 2019-11-24 DIAGNOSIS — R2689 Other abnormalities of gait and mobility: Secondary | ICD-10-CM | POA: Diagnosis not present

## 2019-11-24 DIAGNOSIS — M6281 Muscle weakness (generalized): Secondary | ICD-10-CM | POA: Diagnosis not present

## 2019-11-24 DIAGNOSIS — M545 Low back pain: Secondary | ICD-10-CM | POA: Diagnosis not present

## 2019-11-26 DIAGNOSIS — M5136 Other intervertebral disc degeneration, lumbar region: Secondary | ICD-10-CM | POA: Diagnosis not present

## 2019-11-26 DIAGNOSIS — M545 Low back pain: Secondary | ICD-10-CM | POA: Diagnosis not present

## 2019-11-26 DIAGNOSIS — M4800 Spinal stenosis, site unspecified: Secondary | ICD-10-CM | POA: Diagnosis not present

## 2019-12-02 DIAGNOSIS — Z800 Family history of malignant neoplasm of digestive organs: Secondary | ICD-10-CM | POA: Diagnosis not present

## 2019-12-02 DIAGNOSIS — Z803 Family history of malignant neoplasm of breast: Secondary | ICD-10-CM | POA: Diagnosis not present

## 2019-12-02 DIAGNOSIS — Z8041 Family history of malignant neoplasm of ovary: Secondary | ICD-10-CM | POA: Diagnosis not present

## 2019-12-08 DIAGNOSIS — M4800 Spinal stenosis, site unspecified: Secondary | ICD-10-CM | POA: Diagnosis not present

## 2019-12-08 DIAGNOSIS — M545 Low back pain: Secondary | ICD-10-CM | POA: Diagnosis not present

## 2019-12-08 DIAGNOSIS — M5136 Other intervertebral disc degeneration, lumbar region: Secondary | ICD-10-CM | POA: Diagnosis not present

## 2019-12-11 DIAGNOSIS — M545 Low back pain: Secondary | ICD-10-CM | POA: Diagnosis not present

## 2019-12-11 DIAGNOSIS — M5136 Other intervertebral disc degeneration, lumbar region: Secondary | ICD-10-CM | POA: Diagnosis not present

## 2019-12-11 DIAGNOSIS — M4800 Spinal stenosis, site unspecified: Secondary | ICD-10-CM | POA: Diagnosis not present

## 2019-12-15 DIAGNOSIS — M4800 Spinal stenosis, site unspecified: Secondary | ICD-10-CM | POA: Diagnosis not present

## 2019-12-15 DIAGNOSIS — M545 Low back pain: Secondary | ICD-10-CM | POA: Diagnosis not present

## 2019-12-15 DIAGNOSIS — M5136 Other intervertebral disc degeneration, lumbar region: Secondary | ICD-10-CM | POA: Diagnosis not present

## 2019-12-22 DIAGNOSIS — M4800 Spinal stenosis, site unspecified: Secondary | ICD-10-CM | POA: Diagnosis not present

## 2019-12-22 DIAGNOSIS — M5136 Other intervertebral disc degeneration, lumbar region: Secondary | ICD-10-CM | POA: Diagnosis not present

## 2019-12-22 DIAGNOSIS — Z1231 Encounter for screening mammogram for malignant neoplasm of breast: Secondary | ICD-10-CM | POA: Diagnosis not present

## 2019-12-22 DIAGNOSIS — M545 Low back pain: Secondary | ICD-10-CM | POA: Diagnosis not present

## 2019-12-23 DIAGNOSIS — G894 Chronic pain syndrome: Secondary | ICD-10-CM | POA: Diagnosis not present

## 2019-12-23 DIAGNOSIS — M47816 Spondylosis without myelopathy or radiculopathy, lumbar region: Secondary | ICD-10-CM | POA: Diagnosis not present

## 2019-12-23 DIAGNOSIS — M7071 Other bursitis of hip, right hip: Secondary | ICD-10-CM | POA: Diagnosis not present

## 2019-12-23 DIAGNOSIS — M5136 Other intervertebral disc degeneration, lumbar region: Secondary | ICD-10-CM | POA: Diagnosis not present

## 2019-12-29 DIAGNOSIS — Z1509 Genetic susceptibility to other malignant neoplasm: Secondary | ICD-10-CM | POA: Diagnosis not present

## 2019-12-29 DIAGNOSIS — Z800 Family history of malignant neoplasm of digestive organs: Secondary | ICD-10-CM | POA: Diagnosis not present

## 2019-12-29 DIAGNOSIS — Z8041 Family history of malignant neoplasm of ovary: Secondary | ICD-10-CM | POA: Diagnosis not present

## 2019-12-29 DIAGNOSIS — Z1501 Genetic susceptibility to malignant neoplasm of breast: Secondary | ICD-10-CM | POA: Diagnosis not present

## 2019-12-29 DIAGNOSIS — Z803 Family history of malignant neoplasm of breast: Secondary | ICD-10-CM | POA: Diagnosis not present

## 2019-12-30 DIAGNOSIS — M545 Low back pain: Secondary | ICD-10-CM | POA: Diagnosis not present

## 2020-01-01 DIAGNOSIS — Z1501 Genetic susceptibility to malignant neoplasm of breast: Secondary | ICD-10-CM | POA: Diagnosis not present

## 2020-01-01 DIAGNOSIS — Z1509 Genetic susceptibility to other malignant neoplasm: Secondary | ICD-10-CM | POA: Diagnosis not present

## 2020-01-04 DIAGNOSIS — Z800 Family history of malignant neoplasm of digestive organs: Secondary | ICD-10-CM | POA: Diagnosis not present

## 2020-01-04 DIAGNOSIS — Z1501 Genetic susceptibility to malignant neoplasm of breast: Secondary | ICD-10-CM | POA: Diagnosis not present

## 2020-01-04 DIAGNOSIS — Z803 Family history of malignant neoplasm of breast: Secondary | ICD-10-CM | POA: Diagnosis not present

## 2020-01-04 DIAGNOSIS — Z1509 Genetic susceptibility to other malignant neoplasm: Secondary | ICD-10-CM | POA: Diagnosis not present

## 2020-01-04 DIAGNOSIS — Z8041 Family history of malignant neoplasm of ovary: Secondary | ICD-10-CM | POA: Diagnosis not present

## 2020-01-04 DIAGNOSIS — Z808 Family history of malignant neoplasm of other organs or systems: Secondary | ICD-10-CM | POA: Diagnosis not present

## 2020-01-04 DIAGNOSIS — Z1231 Encounter for screening mammogram for malignant neoplasm of breast: Secondary | ICD-10-CM | POA: Diagnosis not present

## 2020-01-15 DIAGNOSIS — M7071 Other bursitis of hip, right hip: Secondary | ICD-10-CM | POA: Diagnosis not present

## 2020-01-18 DIAGNOSIS — R69 Illness, unspecified: Secondary | ICD-10-CM | POA: Diagnosis not present

## 2020-01-19 DIAGNOSIS — Z7982 Long term (current) use of aspirin: Secondary | ICD-10-CM | POA: Diagnosis not present

## 2020-01-19 DIAGNOSIS — M47816 Spondylosis without myelopathy or radiculopathy, lumbar region: Secondary | ICD-10-CM | POA: Diagnosis not present

## 2020-01-19 DIAGNOSIS — Z888 Allergy status to other drugs, medicaments and biological substances status: Secondary | ICD-10-CM | POA: Diagnosis not present

## 2020-01-19 DIAGNOSIS — I10 Essential (primary) hypertension: Secondary | ICD-10-CM | POA: Diagnosis not present

## 2020-01-19 DIAGNOSIS — E785 Hyperlipidemia, unspecified: Secondary | ICD-10-CM | POA: Diagnosis not present

## 2020-01-19 DIAGNOSIS — Z79899 Other long term (current) drug therapy: Secondary | ICD-10-CM | POA: Diagnosis not present

## 2020-01-20 DIAGNOSIS — I10 Essential (primary) hypertension: Secondary | ICD-10-CM | POA: Diagnosis not present

## 2020-01-20 DIAGNOSIS — Z1501 Genetic susceptibility to malignant neoplasm of breast: Secondary | ICD-10-CM | POA: Diagnosis not present

## 2020-01-20 DIAGNOSIS — Z1509 Genetic susceptibility to other malignant neoplasm: Secondary | ICD-10-CM | POA: Diagnosis not present

## 2020-01-20 DIAGNOSIS — E669 Obesity, unspecified: Secondary | ICD-10-CM | POA: Diagnosis not present

## 2020-01-20 DIAGNOSIS — Z1239 Encounter for other screening for malignant neoplasm of breast: Secondary | ICD-10-CM | POA: Diagnosis not present

## 2020-01-21 DIAGNOSIS — Z1509 Genetic susceptibility to other malignant neoplasm: Secondary | ICD-10-CM | POA: Diagnosis not present

## 2020-01-21 DIAGNOSIS — Z20822 Contact with and (suspected) exposure to covid-19: Secondary | ICD-10-CM | POA: Diagnosis not present

## 2020-01-21 DIAGNOSIS — Z1501 Genetic susceptibility to malignant neoplasm of breast: Secondary | ICD-10-CM | POA: Diagnosis not present

## 2020-01-21 DIAGNOSIS — Z01812 Encounter for preprocedural laboratory examination: Secondary | ICD-10-CM | POA: Diagnosis not present

## 2020-01-26 DIAGNOSIS — G4733 Obstructive sleep apnea (adult) (pediatric): Secondary | ICD-10-CM | POA: Diagnosis not present

## 2020-01-26 DIAGNOSIS — I447 Left bundle-branch block, unspecified: Secondary | ICD-10-CM | POA: Diagnosis not present

## 2020-01-26 DIAGNOSIS — K219 Gastro-esophageal reflux disease without esophagitis: Secondary | ICD-10-CM | POA: Diagnosis not present

## 2020-01-26 DIAGNOSIS — I10 Essential (primary) hypertension: Secondary | ICD-10-CM | POA: Diagnosis not present

## 2020-01-26 DIAGNOSIS — Z01810 Encounter for preprocedural cardiovascular examination: Secondary | ICD-10-CM | POA: Diagnosis not present

## 2020-01-26 DIAGNOSIS — Z01818 Encounter for other preprocedural examination: Secondary | ICD-10-CM | POA: Diagnosis not present

## 2020-01-26 DIAGNOSIS — Z1501 Genetic susceptibility to malignant neoplasm of breast: Secondary | ICD-10-CM | POA: Diagnosis not present

## 2020-01-26 DIAGNOSIS — M4802 Spinal stenosis, cervical region: Secondary | ICD-10-CM | POA: Diagnosis not present

## 2020-01-26 DIAGNOSIS — E785 Hyperlipidemia, unspecified: Secondary | ICD-10-CM | POA: Diagnosis not present

## 2020-01-26 DIAGNOSIS — Z1509 Genetic susceptibility to other malignant neoplasm: Secondary | ICD-10-CM | POA: Diagnosis not present

## 2020-01-27 DIAGNOSIS — I447 Left bundle-branch block, unspecified: Secondary | ICD-10-CM | POA: Diagnosis not present

## 2020-01-28 DIAGNOSIS — N904 Leukoplakia of vulva: Secondary | ICD-10-CM | POA: Diagnosis not present

## 2020-01-28 DIAGNOSIS — N9089 Other specified noninflammatory disorders of vulva and perineum: Secondary | ICD-10-CM | POA: Diagnosis not present

## 2020-01-28 DIAGNOSIS — Z1509 Genetic susceptibility to other malignant neoplasm: Secondary | ICD-10-CM | POA: Diagnosis not present

## 2020-01-28 DIAGNOSIS — Z90722 Acquired absence of ovaries, bilateral: Secondary | ICD-10-CM | POA: Diagnosis not present

## 2020-01-28 DIAGNOSIS — Z4002 Encounter for prophylactic removal of ovary: Secondary | ICD-10-CM | POA: Diagnosis not present

## 2020-01-28 DIAGNOSIS — N839 Noninflammatory disorder of ovary, fallopian tube and broad ligament, unspecified: Secondary | ICD-10-CM | POA: Diagnosis not present

## 2020-01-28 DIAGNOSIS — Z1501 Genetic susceptibility to malignant neoplasm of breast: Secondary | ICD-10-CM | POA: Diagnosis not present

## 2020-01-28 DIAGNOSIS — D282 Benign neoplasm of uterine tubes and ligaments: Secondary | ICD-10-CM | POA: Diagnosis not present

## 2020-01-28 DIAGNOSIS — L900 Lichen sclerosus et atrophicus: Secondary | ICD-10-CM | POA: Diagnosis not present

## 2020-01-28 DIAGNOSIS — N135 Crossing vessel and stricture of ureter without hydronephrosis: Secondary | ICD-10-CM | POA: Diagnosis not present

## 2020-01-28 DIAGNOSIS — N736 Female pelvic peritoneal adhesions (postinfective): Secondary | ICD-10-CM | POA: Diagnosis not present

## 2020-02-26 DIAGNOSIS — Z9079 Acquired absence of other genital organ(s): Secondary | ICD-10-CM | POA: Diagnosis not present

## 2020-02-26 DIAGNOSIS — L900 Lichen sclerosus et atrophicus: Secondary | ICD-10-CM | POA: Diagnosis not present

## 2020-02-26 DIAGNOSIS — Z9889 Other specified postprocedural states: Secondary | ICD-10-CM | POA: Diagnosis not present

## 2020-02-26 DIAGNOSIS — Z1501 Genetic susceptibility to malignant neoplasm of breast: Secondary | ICD-10-CM | POA: Diagnosis not present

## 2020-02-26 DIAGNOSIS — Z1509 Genetic susceptibility to other malignant neoplasm: Secondary | ICD-10-CM | POA: Diagnosis not present

## 2020-02-26 DIAGNOSIS — Z90722 Acquired absence of ovaries, bilateral: Secondary | ICD-10-CM | POA: Diagnosis not present

## 2020-02-29 DIAGNOSIS — Z1509 Genetic susceptibility to other malignant neoplasm: Secondary | ICD-10-CM | POA: Diagnosis not present

## 2020-02-29 DIAGNOSIS — Z803 Family history of malignant neoplasm of breast: Secondary | ICD-10-CM | POA: Diagnosis not present

## 2020-02-29 DIAGNOSIS — Z1501 Genetic susceptibility to malignant neoplasm of breast: Secondary | ICD-10-CM | POA: Diagnosis not present

## 2020-03-01 DIAGNOSIS — M8949 Other hypertrophic osteoarthropathy, multiple sites: Secondary | ICD-10-CM | POA: Diagnosis not present

## 2020-03-01 DIAGNOSIS — K219 Gastro-esophageal reflux disease without esophagitis: Secondary | ICD-10-CM | POA: Diagnosis not present

## 2020-03-01 DIAGNOSIS — R69 Illness, unspecified: Secondary | ICD-10-CM | POA: Diagnosis not present

## 2020-03-15 DIAGNOSIS — Z1501 Genetic susceptibility to malignant neoplasm of breast: Secondary | ICD-10-CM | POA: Diagnosis not present

## 2020-03-15 DIAGNOSIS — Z1239 Encounter for other screening for malignant neoplasm of breast: Secondary | ICD-10-CM | POA: Diagnosis not present

## 2020-03-17 DIAGNOSIS — K8689 Other specified diseases of pancreas: Secondary | ICD-10-CM | POA: Diagnosis not present

## 2020-03-17 DIAGNOSIS — Z800 Family history of malignant neoplasm of digestive organs: Secondary | ICD-10-CM | POA: Diagnosis not present

## 2020-03-17 DIAGNOSIS — Z1289 Encounter for screening for malignant neoplasm of other sites: Secondary | ICD-10-CM | POA: Diagnosis not present

## 2020-03-17 DIAGNOSIS — K8020 Calculus of gallbladder without cholecystitis without obstruction: Secondary | ICD-10-CM | POA: Diagnosis not present

## 2020-03-17 DIAGNOSIS — Z1501 Genetic susceptibility to malignant neoplasm of breast: Secondary | ICD-10-CM | POA: Diagnosis not present

## 2020-04-04 DIAGNOSIS — M25561 Pain in right knee: Secondary | ICD-10-CM | POA: Diagnosis not present

## 2020-04-04 DIAGNOSIS — M1711 Unilateral primary osteoarthritis, right knee: Secondary | ICD-10-CM | POA: Diagnosis not present

## 2020-04-08 DIAGNOSIS — Z79899 Other long term (current) drug therapy: Secondary | ICD-10-CM | POA: Diagnosis not present

## 2020-04-08 DIAGNOSIS — I10 Essential (primary) hypertension: Secondary | ICD-10-CM | POA: Diagnosis not present

## 2020-04-08 DIAGNOSIS — E785 Hyperlipidemia, unspecified: Secondary | ICD-10-CM | POA: Diagnosis not present

## 2020-04-08 DIAGNOSIS — Z888 Allergy status to other drugs, medicaments and biological substances status: Secondary | ICD-10-CM | POA: Diagnosis not present

## 2020-04-08 DIAGNOSIS — Z87442 Personal history of urinary calculi: Secondary | ICD-10-CM | POA: Diagnosis not present

## 2020-04-08 DIAGNOSIS — G894 Chronic pain syndrome: Secondary | ICD-10-CM | POA: Diagnosis not present

## 2020-04-08 DIAGNOSIS — M47816 Spondylosis without myelopathy or radiculopathy, lumbar region: Secondary | ICD-10-CM | POA: Diagnosis not present

## 2020-04-08 DIAGNOSIS — Z881 Allergy status to other antibiotic agents status: Secondary | ICD-10-CM | POA: Diagnosis not present

## 2020-04-13 DIAGNOSIS — Z1501 Genetic susceptibility to malignant neoplasm of breast: Secondary | ICD-10-CM | POA: Diagnosis not present

## 2020-04-13 DIAGNOSIS — Z4001 Encounter for prophylactic removal of breast: Secondary | ICD-10-CM | POA: Diagnosis not present

## 2020-04-13 DIAGNOSIS — Z808 Family history of malignant neoplasm of other organs or systems: Secondary | ICD-10-CM | POA: Diagnosis not present

## 2020-04-13 DIAGNOSIS — Z8041 Family history of malignant neoplasm of ovary: Secondary | ICD-10-CM | POA: Diagnosis not present

## 2020-04-13 DIAGNOSIS — Z803 Family history of malignant neoplasm of breast: Secondary | ICD-10-CM | POA: Diagnosis not present

## 2020-04-14 DIAGNOSIS — I454 Nonspecific intraventricular block: Secondary | ICD-10-CM | POA: Diagnosis not present

## 2020-04-14 DIAGNOSIS — R001 Bradycardia, unspecified: Secondary | ICD-10-CM | POA: Diagnosis not present

## 2020-04-25 DIAGNOSIS — M5136 Other intervertebral disc degeneration, lumbar region: Secondary | ICD-10-CM | POA: Diagnosis not present

## 2020-04-25 DIAGNOSIS — Z79899 Other long term (current) drug therapy: Secondary | ICD-10-CM | POA: Diagnosis not present

## 2020-04-25 DIAGNOSIS — G894 Chronic pain syndrome: Secondary | ICD-10-CM | POA: Diagnosis not present

## 2020-04-25 DIAGNOSIS — M47816 Spondylosis without myelopathy or radiculopathy, lumbar region: Secondary | ICD-10-CM | POA: Diagnosis not present

## 2020-04-27 DIAGNOSIS — Z1509 Genetic susceptibility to other malignant neoplasm: Secondary | ICD-10-CM | POA: Diagnosis not present

## 2020-04-27 DIAGNOSIS — Z881 Allergy status to other antibiotic agents status: Secondary | ICD-10-CM | POA: Diagnosis not present

## 2020-04-27 DIAGNOSIS — Z1501 Genetic susceptibility to malignant neoplasm of breast: Secondary | ICD-10-CM | POA: Diagnosis not present

## 2020-04-27 DIAGNOSIS — Z888 Allergy status to other drugs, medicaments and biological substances status: Secondary | ICD-10-CM | POA: Diagnosis not present

## 2020-04-27 DIAGNOSIS — K760 Fatty (change of) liver, not elsewhere classified: Secondary | ICD-10-CM | POA: Diagnosis not present

## 2020-04-28 DIAGNOSIS — G8918 Other acute postprocedural pain: Secondary | ICD-10-CM | POA: Diagnosis not present

## 2020-04-28 DIAGNOSIS — N6012 Diffuse cystic mastopathy of left breast: Secondary | ICD-10-CM | POA: Diagnosis not present

## 2020-04-28 DIAGNOSIS — Z1501 Genetic susceptibility to malignant neoplasm of breast: Secondary | ICD-10-CM | POA: Diagnosis not present

## 2020-04-28 DIAGNOSIS — N6011 Diffuse cystic mastopathy of right breast: Secondary | ICD-10-CM | POA: Diagnosis not present

## 2020-04-28 DIAGNOSIS — Z4001 Encounter for prophylactic removal of breast: Secondary | ICD-10-CM | POA: Diagnosis not present

## 2020-04-28 DIAGNOSIS — N6489 Other specified disorders of breast: Secondary | ICD-10-CM | POA: Diagnosis not present

## 2020-05-06 DIAGNOSIS — F5101 Primary insomnia: Secondary | ICD-10-CM | POA: Diagnosis not present

## 2020-05-06 DIAGNOSIS — R001 Bradycardia, unspecified: Secondary | ICD-10-CM | POA: Diagnosis not present

## 2020-05-06 DIAGNOSIS — I10 Essential (primary) hypertension: Secondary | ICD-10-CM | POA: Diagnosis not present

## 2020-05-06 DIAGNOSIS — R69 Illness, unspecified: Secondary | ICD-10-CM | POA: Diagnosis not present

## 2020-05-13 DIAGNOSIS — I10 Essential (primary) hypertension: Secondary | ICD-10-CM | POA: Diagnosis not present

## 2020-05-13 DIAGNOSIS — R001 Bradycardia, unspecified: Secondary | ICD-10-CM | POA: Diagnosis not present

## 2020-05-13 DIAGNOSIS — R69 Illness, unspecified: Secondary | ICD-10-CM | POA: Diagnosis not present

## 2020-05-13 DIAGNOSIS — F5101 Primary insomnia: Secondary | ICD-10-CM | POA: Diagnosis not present

## 2020-06-06 DIAGNOSIS — R69 Illness, unspecified: Secondary | ICD-10-CM | POA: Diagnosis not present

## 2020-06-06 DIAGNOSIS — E782 Mixed hyperlipidemia: Secondary | ICD-10-CM | POA: Diagnosis not present

## 2020-06-06 DIAGNOSIS — I10 Essential (primary) hypertension: Secondary | ICD-10-CM | POA: Diagnosis not present

## 2020-06-06 DIAGNOSIS — R001 Bradycardia, unspecified: Secondary | ICD-10-CM | POA: Diagnosis not present

## 2020-06-08 DIAGNOSIS — I447 Left bundle-branch block, unspecified: Secondary | ICD-10-CM | POA: Diagnosis not present

## 2020-06-08 DIAGNOSIS — R001 Bradycardia, unspecified: Secondary | ICD-10-CM | POA: Diagnosis not present

## 2020-06-08 DIAGNOSIS — R5383 Other fatigue: Secondary | ICD-10-CM | POA: Diagnosis not present

## 2020-06-21 DIAGNOSIS — R69 Illness, unspecified: Secondary | ICD-10-CM | POA: Diagnosis not present

## 2020-06-21 DIAGNOSIS — L900 Lichen sclerosus et atrophicus: Secondary | ICD-10-CM | POA: Diagnosis not present

## 2020-06-21 DIAGNOSIS — N904 Leukoplakia of vulva: Secondary | ICD-10-CM | POA: Diagnosis not present

## 2020-06-21 DIAGNOSIS — Z1509 Genetic susceptibility to other malignant neoplasm: Secondary | ICD-10-CM | POA: Diagnosis not present

## 2020-06-21 DIAGNOSIS — Z1501 Genetic susceptibility to malignant neoplasm of breast: Secondary | ICD-10-CM | POA: Diagnosis not present

## 2020-06-23 DIAGNOSIS — Z961 Presence of intraocular lens: Secondary | ICD-10-CM | POA: Diagnosis not present

## 2020-06-23 DIAGNOSIS — H524 Presbyopia: Secondary | ICD-10-CM | POA: Diagnosis not present

## 2020-06-23 DIAGNOSIS — H52203 Unspecified astigmatism, bilateral: Secondary | ICD-10-CM | POA: Diagnosis not present

## 2020-07-22 DIAGNOSIS — Z8679 Personal history of other diseases of the circulatory system: Secondary | ICD-10-CM | POA: Diagnosis not present

## 2020-07-22 DIAGNOSIS — R001 Bradycardia, unspecified: Secondary | ICD-10-CM | POA: Diagnosis not present

## 2020-07-22 DIAGNOSIS — I517 Cardiomegaly: Secondary | ICD-10-CM | POA: Diagnosis not present

## 2020-08-22 DIAGNOSIS — T1490XA Injury, unspecified, initial encounter: Secondary | ICD-10-CM | POA: Diagnosis not present

## 2020-08-29 DIAGNOSIS — L720 Epidermal cyst: Secondary | ICD-10-CM | POA: Diagnosis not present

## 2020-08-29 DIAGNOSIS — L821 Other seborrheic keratosis: Secondary | ICD-10-CM | POA: Diagnosis not present

## 2020-08-29 DIAGNOSIS — L570 Actinic keratosis: Secondary | ICD-10-CM | POA: Diagnosis not present

## 2020-08-29 DIAGNOSIS — X32XXXS Exposure to sunlight, sequela: Secondary | ICD-10-CM | POA: Diagnosis not present

## 2020-08-29 DIAGNOSIS — L814 Other melanin hyperpigmentation: Secondary | ICD-10-CM | POA: Diagnosis not present

## 2020-08-30 DIAGNOSIS — Z1501 Genetic susceptibility to malignant neoplasm of breast: Secondary | ICD-10-CM | POA: Diagnosis not present

## 2020-08-30 DIAGNOSIS — Z1509 Genetic susceptibility to other malignant neoplasm: Secondary | ICD-10-CM | POA: Diagnosis not present

## 2020-08-30 DIAGNOSIS — Z800 Family history of malignant neoplasm of digestive organs: Secondary | ICD-10-CM | POA: Diagnosis not present

## 2020-11-16 DIAGNOSIS — Z0000 Encounter for general adult medical examination without abnormal findings: Secondary | ICD-10-CM | POA: Diagnosis not present

## 2020-11-16 DIAGNOSIS — Z8249 Family history of ischemic heart disease and other diseases of the circulatory system: Secondary | ICD-10-CM | POA: Diagnosis not present

## 2020-11-16 DIAGNOSIS — M47816 Spondylosis without myelopathy or radiculopathy, lumbar region: Secondary | ICD-10-CM | POA: Diagnosis not present

## 2020-11-16 DIAGNOSIS — Z809 Family history of malignant neoplasm, unspecified: Secondary | ICD-10-CM | POA: Diagnosis not present

## 2020-11-16 DIAGNOSIS — M5136 Other intervertebral disc degeneration, lumbar region: Secondary | ICD-10-CM | POA: Diagnosis not present

## 2020-11-16 DIAGNOSIS — E782 Mixed hyperlipidemia: Secondary | ICD-10-CM | POA: Diagnosis not present

## 2020-11-16 DIAGNOSIS — R69 Illness, unspecified: Secondary | ICD-10-CM | POA: Diagnosis not present

## 2020-11-16 DIAGNOSIS — Z833 Family history of diabetes mellitus: Secondary | ICD-10-CM | POA: Diagnosis not present

## 2020-11-16 DIAGNOSIS — M25561 Pain in right knee: Secondary | ICD-10-CM | POA: Diagnosis not present

## 2020-11-16 DIAGNOSIS — M1711 Unilateral primary osteoarthritis, right knee: Secondary | ICD-10-CM | POA: Diagnosis not present

## 2020-11-16 DIAGNOSIS — G894 Chronic pain syndrome: Secondary | ICD-10-CM | POA: Diagnosis not present

## 2020-11-21 DIAGNOSIS — E782 Mixed hyperlipidemia: Secondary | ICD-10-CM | POA: Diagnosis not present

## 2020-11-21 DIAGNOSIS — R69 Illness, unspecified: Secondary | ICD-10-CM | POA: Diagnosis not present

## 2020-12-01 ENCOUNTER — Telehealth: Payer: Self-pay | Admitting: General Practice

## 2020-12-01 NOTE — Telephone Encounter (Signed)
I called patient to schedule Medicare Wellness Visit, patient stated that she has a new PCP.

## 2020-12-22 DIAGNOSIS — R69 Illness, unspecified: Secondary | ICD-10-CM | POA: Diagnosis not present

## 2020-12-22 DIAGNOSIS — Z90722 Acquired absence of ovaries, bilateral: Secondary | ICD-10-CM | POA: Diagnosis not present

## 2020-12-22 DIAGNOSIS — Z79899 Other long term (current) drug therapy: Secondary | ICD-10-CM | POA: Diagnosis not present

## 2020-12-22 DIAGNOSIS — Z1501 Genetic susceptibility to malignant neoplasm of breast: Secondary | ICD-10-CM | POA: Diagnosis not present

## 2020-12-22 DIAGNOSIS — Z9013 Acquired absence of bilateral breasts and nipples: Secondary | ICD-10-CM | POA: Diagnosis not present

## 2020-12-22 DIAGNOSIS — M1711 Unilateral primary osteoarthritis, right knee: Secondary | ICD-10-CM | POA: Diagnosis not present

## 2020-12-26 DIAGNOSIS — G4733 Obstructive sleep apnea (adult) (pediatric): Secondary | ICD-10-CM | POA: Diagnosis not present

## 2020-12-26 DIAGNOSIS — G894 Chronic pain syndrome: Secondary | ICD-10-CM | POA: Diagnosis not present

## 2020-12-26 DIAGNOSIS — M549 Dorsalgia, unspecified: Secondary | ICD-10-CM | POA: Diagnosis not present

## 2020-12-26 DIAGNOSIS — M47816 Spondylosis without myelopathy or radiculopathy, lumbar region: Secondary | ICD-10-CM | POA: Diagnosis not present

## 2020-12-26 DIAGNOSIS — M5136 Other intervertebral disc degeneration, lumbar region: Secondary | ICD-10-CM | POA: Diagnosis not present

## 2020-12-26 DIAGNOSIS — Z888 Allergy status to other drugs, medicaments and biological substances status: Secondary | ICD-10-CM | POA: Diagnosis not present

## 2020-12-26 DIAGNOSIS — E785 Hyperlipidemia, unspecified: Secondary | ICD-10-CM | POA: Diagnosis not present

## 2020-12-26 DIAGNOSIS — K219 Gastro-esophageal reflux disease without esophagitis: Secondary | ICD-10-CM | POA: Diagnosis not present

## 2020-12-26 DIAGNOSIS — I10 Essential (primary) hypertension: Secondary | ICD-10-CM | POA: Diagnosis not present

## 2020-12-26 DIAGNOSIS — Z79899 Other long term (current) drug therapy: Secondary | ICD-10-CM | POA: Diagnosis not present

## 2020-12-29 DIAGNOSIS — M1711 Unilateral primary osteoarthritis, right knee: Secondary | ICD-10-CM | POA: Diagnosis not present

## 2020-12-29 DIAGNOSIS — M25561 Pain in right knee: Secondary | ICD-10-CM | POA: Diagnosis not present

## 2021-01-05 DIAGNOSIS — M1711 Unilateral primary osteoarthritis, right knee: Secondary | ICD-10-CM | POA: Diagnosis not present

## 2021-01-05 DIAGNOSIS — M25561 Pain in right knee: Secondary | ICD-10-CM | POA: Diagnosis not present

## 2021-01-12 DIAGNOSIS — M25561 Pain in right knee: Secondary | ICD-10-CM | POA: Diagnosis not present

## 2021-01-12 DIAGNOSIS — M1711 Unilateral primary osteoarthritis, right knee: Secondary | ICD-10-CM | POA: Diagnosis not present

## 2021-01-31 DIAGNOSIS — G894 Chronic pain syndrome: Secondary | ICD-10-CM | POA: Diagnosis not present

## 2021-01-31 DIAGNOSIS — M5136 Other intervertebral disc degeneration, lumbar region: Secondary | ICD-10-CM | POA: Diagnosis not present

## 2021-01-31 DIAGNOSIS — M47816 Spondylosis without myelopathy or radiculopathy, lumbar region: Secondary | ICD-10-CM | POA: Diagnosis not present

## 2021-02-21 DIAGNOSIS — R413 Other amnesia: Secondary | ICD-10-CM | POA: Diagnosis not present

## 2021-02-21 DIAGNOSIS — I10 Essential (primary) hypertension: Secondary | ICD-10-CM | POA: Diagnosis not present

## 2021-02-21 DIAGNOSIS — R69 Illness, unspecified: Secondary | ICD-10-CM | POA: Diagnosis not present

## 2021-02-24 DIAGNOSIS — M5136 Other intervertebral disc degeneration, lumbar region: Secondary | ICD-10-CM | POA: Diagnosis not present

## 2021-03-15 DIAGNOSIS — R001 Bradycardia, unspecified: Secondary | ICD-10-CM | POA: Diagnosis not present

## 2021-03-15 DIAGNOSIS — R5383 Other fatigue: Secondary | ICD-10-CM | POA: Diagnosis not present

## 2021-03-15 DIAGNOSIS — I447 Left bundle-branch block, unspecified: Secondary | ICD-10-CM | POA: Diagnosis not present

## 2021-03-15 DIAGNOSIS — Z9013 Acquired absence of bilateral breasts and nipples: Secondary | ICD-10-CM | POA: Diagnosis not present

## 2021-03-16 DIAGNOSIS — R001 Bradycardia, unspecified: Secondary | ICD-10-CM | POA: Diagnosis not present

## 2021-03-16 DIAGNOSIS — I447 Left bundle-branch block, unspecified: Secondary | ICD-10-CM | POA: Diagnosis not present

## 2021-03-23 DIAGNOSIS — Z1509 Genetic susceptibility to other malignant neoplasm: Secondary | ICD-10-CM | POA: Diagnosis not present

## 2021-03-23 DIAGNOSIS — Z1501 Genetic susceptibility to malignant neoplasm of breast: Secondary | ICD-10-CM | POA: Diagnosis not present

## 2021-03-23 DIAGNOSIS — Z800 Family history of malignant neoplasm of digestive organs: Secondary | ICD-10-CM | POA: Diagnosis not present

## 2021-03-23 DIAGNOSIS — Z1289 Encounter for screening for malignant neoplasm of other sites: Secondary | ICD-10-CM | POA: Diagnosis not present

## 2021-03-23 DIAGNOSIS — K862 Cyst of pancreas: Secondary | ICD-10-CM | POA: Diagnosis not present

## 2021-04-07 DIAGNOSIS — M5136 Other intervertebral disc degeneration, lumbar region: Secondary | ICD-10-CM | POA: Diagnosis not present

## 2021-04-07 DIAGNOSIS — M48061 Spinal stenosis, lumbar region without neurogenic claudication: Secondary | ICD-10-CM | POA: Diagnosis not present

## 2021-04-07 DIAGNOSIS — G894 Chronic pain syndrome: Secondary | ICD-10-CM | POA: Diagnosis not present

## 2021-04-07 DIAGNOSIS — M47816 Spondylosis without myelopathy or radiculopathy, lumbar region: Secondary | ICD-10-CM | POA: Diagnosis not present

## 2021-05-05 DIAGNOSIS — Z7689 Persons encountering health services in other specified circumstances: Secondary | ICD-10-CM | POA: Diagnosis not present

## 2021-05-05 DIAGNOSIS — I10 Essential (primary) hypertension: Secondary | ICD-10-CM | POA: Diagnosis not present

## 2021-05-05 DIAGNOSIS — E782 Mixed hyperlipidemia: Secondary | ICD-10-CM | POA: Diagnosis not present

## 2021-05-08 DIAGNOSIS — E782 Mixed hyperlipidemia: Secondary | ICD-10-CM | POA: Diagnosis not present

## 2021-05-08 DIAGNOSIS — I10 Essential (primary) hypertension: Secondary | ICD-10-CM | POA: Diagnosis not present

## 2021-05-08 DIAGNOSIS — R69 Illness, unspecified: Secondary | ICD-10-CM | POA: Diagnosis not present

## 2021-05-08 DIAGNOSIS — M159 Polyosteoarthritis, unspecified: Secondary | ICD-10-CM | POA: Diagnosis not present

## 2021-05-08 DIAGNOSIS — Z0000 Encounter for general adult medical examination without abnormal findings: Secondary | ICD-10-CM | POA: Diagnosis not present

## 2021-05-08 DIAGNOSIS — G4733 Obstructive sleep apnea (adult) (pediatric): Secondary | ICD-10-CM | POA: Diagnosis not present

## 2021-05-08 DIAGNOSIS — R001 Bradycardia, unspecified: Secondary | ICD-10-CM | POA: Diagnosis not present

## 2021-05-08 DIAGNOSIS — K219 Gastro-esophageal reflux disease without esophagitis: Secondary | ICD-10-CM | POA: Diagnosis not present

## 2021-06-02 DIAGNOSIS — F332 Major depressive disorder, recurrent severe without psychotic features: Secondary | ICD-10-CM | POA: Diagnosis not present

## 2021-06-02 DIAGNOSIS — K219 Gastro-esophageal reflux disease without esophagitis: Secondary | ICD-10-CM | POA: Diagnosis not present

## 2021-06-02 DIAGNOSIS — Z79899 Other long term (current) drug therapy: Secondary | ICD-10-CM | POA: Diagnosis not present

## 2021-06-02 DIAGNOSIS — G4733 Obstructive sleep apnea (adult) (pediatric): Secondary | ICD-10-CM | POA: Diagnosis not present

## 2021-06-02 DIAGNOSIS — R001 Bradycardia, unspecified: Secondary | ICD-10-CM | POA: Diagnosis not present

## 2021-06-02 DIAGNOSIS — M1990 Unspecified osteoarthritis, unspecified site: Secondary | ICD-10-CM | POA: Diagnosis not present

## 2021-06-02 DIAGNOSIS — Z90721 Acquired absence of ovaries, unilateral: Secondary | ICD-10-CM | POA: Diagnosis not present

## 2021-06-02 DIAGNOSIS — E785 Hyperlipidemia, unspecified: Secondary | ICD-10-CM | POA: Diagnosis not present

## 2021-06-02 DIAGNOSIS — G4700 Insomnia, unspecified: Secondary | ICD-10-CM | POA: Diagnosis not present

## 2021-06-02 DIAGNOSIS — R69 Illness, unspecified: Secondary | ICD-10-CM | POA: Diagnosis not present

## 2021-06-02 DIAGNOSIS — Z1501 Genetic susceptibility to malignant neoplasm of breast: Secondary | ICD-10-CM | POA: Diagnosis not present

## 2021-06-02 DIAGNOSIS — R413 Other amnesia: Secondary | ICD-10-CM | POA: Diagnosis not present

## 2021-06-02 DIAGNOSIS — I447 Left bundle-branch block, unspecified: Secondary | ICD-10-CM | POA: Diagnosis not present

## 2021-06-02 DIAGNOSIS — Z818 Family history of other mental and behavioral disorders: Secondary | ICD-10-CM | POA: Diagnosis not present

## 2021-06-02 DIAGNOSIS — Z9989 Dependence on other enabling machines and devices: Secondary | ICD-10-CM | POA: Diagnosis not present

## 2021-06-02 DIAGNOSIS — I10 Essential (primary) hypertension: Secondary | ICD-10-CM | POA: Diagnosis not present

## 2021-06-02 DIAGNOSIS — F419 Anxiety disorder, unspecified: Secondary | ICD-10-CM | POA: Diagnosis not present

## 2021-08-08 DIAGNOSIS — J22 Unspecified acute lower respiratory infection: Secondary | ICD-10-CM | POA: Diagnosis not present

## 2021-08-08 DIAGNOSIS — Z20822 Contact with and (suspected) exposure to covid-19: Secondary | ICD-10-CM | POA: Diagnosis not present

## 2021-09-01 DIAGNOSIS — Z789 Other specified health status: Secondary | ICD-10-CM | POA: Diagnosis not present

## 2021-09-01 DIAGNOSIS — B888 Other specified infestations: Secondary | ICD-10-CM | POA: Diagnosis not present

## 2021-09-01 DIAGNOSIS — W57XXXA Bitten or stung by nonvenomous insect and other nonvenomous arthropods, initial encounter: Secondary | ICD-10-CM | POA: Diagnosis not present

## 2021-09-01 DIAGNOSIS — L239 Allergic contact dermatitis, unspecified cause: Secondary | ICD-10-CM | POA: Diagnosis not present

## 2021-09-01 DIAGNOSIS — Z6830 Body mass index (BMI) 30.0-30.9, adult: Secondary | ICD-10-CM | POA: Diagnosis not present

## 2021-09-01 DIAGNOSIS — L089 Local infection of the skin and subcutaneous tissue, unspecified: Secondary | ICD-10-CM | POA: Diagnosis not present

## 2021-09-08 DIAGNOSIS — I10 Essential (primary) hypertension: Secondary | ICD-10-CM | POA: Diagnosis not present

## 2021-09-08 DIAGNOSIS — E785 Hyperlipidemia, unspecified: Secondary | ICD-10-CM | POA: Diagnosis not present

## 2021-10-03 DIAGNOSIS — H524 Presbyopia: Secondary | ICD-10-CM | POA: Diagnosis not present

## 2021-10-03 DIAGNOSIS — Z961 Presence of intraocular lens: Secondary | ICD-10-CM | POA: Diagnosis not present

## 2021-10-09 DIAGNOSIS — G4733 Obstructive sleep apnea (adult) (pediatric): Secondary | ICD-10-CM | POA: Diagnosis not present

## 2021-10-09 DIAGNOSIS — M792 Neuralgia and neuritis, unspecified: Secondary | ICD-10-CM | POA: Diagnosis not present

## 2021-10-09 DIAGNOSIS — R69 Illness, unspecified: Secondary | ICD-10-CM | POA: Diagnosis not present

## 2021-10-09 DIAGNOSIS — K5900 Constipation, unspecified: Secondary | ICD-10-CM | POA: Diagnosis not present

## 2021-10-09 DIAGNOSIS — Z008 Encounter for other general examination: Secondary | ICD-10-CM | POA: Diagnosis not present

## 2021-10-09 DIAGNOSIS — K219 Gastro-esophageal reflux disease without esophagitis: Secondary | ICD-10-CM | POA: Diagnosis not present

## 2021-10-09 DIAGNOSIS — F419 Anxiety disorder, unspecified: Secondary | ICD-10-CM | POA: Diagnosis not present

## 2021-10-09 DIAGNOSIS — G4700 Insomnia, unspecified: Secondary | ICD-10-CM | POA: Diagnosis not present

## 2021-10-09 DIAGNOSIS — M4800 Spinal stenosis, site unspecified: Secondary | ICD-10-CM | POA: Diagnosis not present

## 2021-10-09 DIAGNOSIS — E785 Hyperlipidemia, unspecified: Secondary | ICD-10-CM | POA: Diagnosis not present

## 2021-10-09 DIAGNOSIS — M1990 Unspecified osteoarthritis, unspecified site: Secondary | ICD-10-CM | POA: Diagnosis not present

## 2021-10-09 DIAGNOSIS — I10 Essential (primary) hypertension: Secondary | ICD-10-CM | POA: Diagnosis not present

## 2021-10-09 DIAGNOSIS — M1711 Unilateral primary osteoarthritis, right knee: Secondary | ICD-10-CM | POA: Diagnosis not present

## 2021-10-09 DIAGNOSIS — E669 Obesity, unspecified: Secondary | ICD-10-CM | POA: Diagnosis not present

## 2021-10-27 DIAGNOSIS — K862 Cyst of pancreas: Secondary | ICD-10-CM | POA: Diagnosis not present

## 2021-10-27 DIAGNOSIS — E782 Mixed hyperlipidemia: Secondary | ICD-10-CM | POA: Diagnosis not present

## 2021-11-07 DIAGNOSIS — E782 Mixed hyperlipidemia: Secondary | ICD-10-CM | POA: Diagnosis not present

## 2021-11-07 DIAGNOSIS — R001 Bradycardia, unspecified: Secondary | ICD-10-CM | POA: Diagnosis not present

## 2021-11-07 DIAGNOSIS — R69 Illness, unspecified: Secondary | ICD-10-CM | POA: Diagnosis not present

## 2021-11-07 DIAGNOSIS — I10 Essential (primary) hypertension: Secondary | ICD-10-CM | POA: Diagnosis not present

## 2021-11-07 DIAGNOSIS — G4733 Obstructive sleep apnea (adult) (pediatric): Secondary | ICD-10-CM | POA: Diagnosis not present

## 2021-11-07 DIAGNOSIS — M159 Polyosteoarthritis, unspecified: Secondary | ICD-10-CM | POA: Diagnosis not present

## 2021-11-08 DIAGNOSIS — M25561 Pain in right knee: Secondary | ICD-10-CM | POA: Diagnosis not present

## 2021-11-08 DIAGNOSIS — I10 Essential (primary) hypertension: Secondary | ICD-10-CM | POA: Diagnosis not present

## 2021-11-08 DIAGNOSIS — E785 Hyperlipidemia, unspecified: Secondary | ICD-10-CM | POA: Diagnosis not present

## 2021-11-13 DIAGNOSIS — L814 Other melanin hyperpigmentation: Secondary | ICD-10-CM | POA: Diagnosis not present

## 2021-11-13 DIAGNOSIS — D225 Melanocytic nevi of trunk: Secondary | ICD-10-CM | POA: Diagnosis not present

## 2021-11-13 DIAGNOSIS — X32XXXS Exposure to sunlight, sequela: Secondary | ICD-10-CM | POA: Diagnosis not present

## 2021-11-13 DIAGNOSIS — L821 Other seborrheic keratosis: Secondary | ICD-10-CM | POA: Diagnosis not present

## 2021-11-13 DIAGNOSIS — L570 Actinic keratosis: Secondary | ICD-10-CM | POA: Diagnosis not present

## 2021-11-14 DIAGNOSIS — M4726 Other spondylosis with radiculopathy, lumbar region: Secondary | ICD-10-CM | POA: Diagnosis not present

## 2021-11-14 DIAGNOSIS — M5136 Other intervertebral disc degeneration, lumbar region: Secondary | ICD-10-CM | POA: Diagnosis not present

## 2021-11-14 DIAGNOSIS — M47816 Spondylosis without myelopathy or radiculopathy, lumbar region: Secondary | ICD-10-CM | POA: Diagnosis not present

## 2021-11-14 DIAGNOSIS — M48061 Spinal stenosis, lumbar region without neurogenic claudication: Secondary | ICD-10-CM | POA: Diagnosis not present

## 2021-11-14 DIAGNOSIS — G894 Chronic pain syndrome: Secondary | ICD-10-CM | POA: Diagnosis not present

## 2021-12-06 DIAGNOSIS — M25561 Pain in right knee: Secondary | ICD-10-CM | POA: Diagnosis not present

## 2021-12-06 DIAGNOSIS — M1711 Unilateral primary osteoarthritis, right knee: Secondary | ICD-10-CM | POA: Diagnosis not present

## 2021-12-18 DIAGNOSIS — M5136 Other intervertebral disc degeneration, lumbar region: Secondary | ICD-10-CM | POA: Diagnosis not present

## 2021-12-20 DIAGNOSIS — M1711 Unilateral primary osteoarthritis, right knee: Secondary | ICD-10-CM | POA: Diagnosis not present

## 2021-12-20 DIAGNOSIS — M25561 Pain in right knee: Secondary | ICD-10-CM | POA: Diagnosis not present

## 2021-12-27 DIAGNOSIS — M1711 Unilateral primary osteoarthritis, right knee: Secondary | ICD-10-CM | POA: Diagnosis not present

## 2021-12-27 DIAGNOSIS — M25561 Pain in right knee: Secondary | ICD-10-CM | POA: Diagnosis not present

## 2022-01-03 DIAGNOSIS — M1711 Unilateral primary osteoarthritis, right knee: Secondary | ICD-10-CM | POA: Diagnosis not present

## 2022-01-03 DIAGNOSIS — M25561 Pain in right knee: Secondary | ICD-10-CM | POA: Diagnosis not present

## 2022-01-11 DIAGNOSIS — M25561 Pain in right knee: Secondary | ICD-10-CM | POA: Diagnosis not present

## 2022-01-11 DIAGNOSIS — M1711 Unilateral primary osteoarthritis, right knee: Secondary | ICD-10-CM | POA: Diagnosis not present

## 2022-03-12 DIAGNOSIS — M50321 Other cervical disc degeneration at C4-C5 level: Secondary | ICD-10-CM | POA: Diagnosis not present

## 2022-03-12 DIAGNOSIS — M50322 Other cervical disc degeneration at C5-C6 level: Secondary | ICD-10-CM | POA: Diagnosis not present

## 2022-03-12 DIAGNOSIS — M5031 Other cervical disc degeneration,  high cervical region: Secondary | ICD-10-CM | POA: Diagnosis not present

## 2022-03-12 DIAGNOSIS — M4802 Spinal stenosis, cervical region: Secondary | ICD-10-CM | POA: Diagnosis not present

## 2022-03-12 DIAGNOSIS — M48061 Spinal stenosis, lumbar region without neurogenic claudication: Secondary | ICD-10-CM | POA: Diagnosis not present

## 2022-03-12 DIAGNOSIS — M5030 Other cervical disc degeneration, unspecified cervical region: Secondary | ICD-10-CM | POA: Diagnosis not present

## 2022-03-12 DIAGNOSIS — M47816 Spondylosis without myelopathy or radiculopathy, lumbar region: Secondary | ICD-10-CM | POA: Diagnosis not present

## 2022-03-12 DIAGNOSIS — G894 Chronic pain syndrome: Secondary | ICD-10-CM | POA: Diagnosis not present

## 2022-03-12 DIAGNOSIS — M5136 Other intervertebral disc degeneration, lumbar region: Secondary | ICD-10-CM | POA: Diagnosis not present

## 2022-03-12 DIAGNOSIS — M50323 Other cervical disc degeneration at C6-C7 level: Secondary | ICD-10-CM | POA: Diagnosis not present

## 2022-03-12 DIAGNOSIS — M47812 Spondylosis without myelopathy or radiculopathy, cervical region: Secondary | ICD-10-CM | POA: Diagnosis not present

## 2022-03-12 DIAGNOSIS — M4726 Other spondylosis with radiculopathy, lumbar region: Secondary | ICD-10-CM | POA: Diagnosis not present

## 2022-03-28 DIAGNOSIS — U071 COVID-19: Secondary | ICD-10-CM | POA: Diagnosis not present

## 2022-03-28 DIAGNOSIS — R6889 Other general symptoms and signs: Secondary | ICD-10-CM | POA: Diagnosis not present

## 2022-04-09 DIAGNOSIS — R0902 Hypoxemia: Secondary | ICD-10-CM | POA: Diagnosis not present

## 2022-04-09 DIAGNOSIS — R059 Cough, unspecified: Secondary | ICD-10-CM | POA: Diagnosis not present

## 2022-05-01 DIAGNOSIS — M4726 Other spondylosis with radiculopathy, lumbar region: Secondary | ICD-10-CM | POA: Diagnosis not present

## 2022-05-01 DIAGNOSIS — M5136 Other intervertebral disc degeneration, lumbar region: Secondary | ICD-10-CM | POA: Diagnosis not present

## 2022-05-01 DIAGNOSIS — M48061 Spinal stenosis, lumbar region without neurogenic claudication: Secondary | ICD-10-CM | POA: Diagnosis not present

## 2022-05-10 DIAGNOSIS — E782 Mixed hyperlipidemia: Secondary | ICD-10-CM | POA: Diagnosis not present

## 2022-05-10 DIAGNOSIS — Z0000 Encounter for general adult medical examination without abnormal findings: Secondary | ICD-10-CM | POA: Diagnosis not present

## 2022-05-10 DIAGNOSIS — I10 Essential (primary) hypertension: Secondary | ICD-10-CM | POA: Diagnosis not present

## 2022-05-17 DIAGNOSIS — Z6831 Body mass index (BMI) 31.0-31.9, adult: Secondary | ICD-10-CM | POA: Diagnosis not present

## 2022-05-17 DIAGNOSIS — E782 Mixed hyperlipidemia: Secondary | ICD-10-CM | POA: Diagnosis not present

## 2022-05-17 DIAGNOSIS — M47816 Spondylosis without myelopathy or radiculopathy, lumbar region: Secondary | ICD-10-CM | POA: Diagnosis not present

## 2022-05-17 DIAGNOSIS — Z0000 Encounter for general adult medical examination without abnormal findings: Secondary | ICD-10-CM | POA: Diagnosis not present

## 2022-05-17 DIAGNOSIS — M4802 Spinal stenosis, cervical region: Secondary | ICD-10-CM | POA: Diagnosis not present

## 2022-05-17 DIAGNOSIS — R69 Illness, unspecified: Secondary | ICD-10-CM | POA: Diagnosis not present

## 2022-05-17 DIAGNOSIS — I10 Essential (primary) hypertension: Secondary | ICD-10-CM | POA: Diagnosis not present

## 2022-05-18 DIAGNOSIS — I10 Essential (primary) hypertension: Secondary | ICD-10-CM | POA: Diagnosis not present

## 2022-05-18 DIAGNOSIS — R69 Illness, unspecified: Secondary | ICD-10-CM | POA: Diagnosis not present

## 2022-05-18 DIAGNOSIS — G4733 Obstructive sleep apnea (adult) (pediatric): Secondary | ICD-10-CM | POA: Diagnosis not present

## 2022-05-25 DIAGNOSIS — M47816 Spondylosis without myelopathy or radiculopathy, lumbar region: Secondary | ICD-10-CM | POA: Diagnosis not present

## 2022-05-25 DIAGNOSIS — G894 Chronic pain syndrome: Secondary | ICD-10-CM | POA: Diagnosis not present

## 2022-05-25 DIAGNOSIS — M48061 Spinal stenosis, lumbar region without neurogenic claudication: Secondary | ICD-10-CM | POA: Diagnosis not present

## 2022-05-25 DIAGNOSIS — S93492A Sprain of other ligament of left ankle, initial encounter: Secondary | ICD-10-CM | POA: Diagnosis not present

## 2022-05-25 DIAGNOSIS — M5136 Other intervertebral disc degeneration, lumbar region: Secondary | ICD-10-CM | POA: Diagnosis not present

## 2022-05-25 DIAGNOSIS — M25572 Pain in left ankle and joints of left foot: Secondary | ICD-10-CM | POA: Diagnosis not present

## 2022-05-30 DIAGNOSIS — W908XXS Exposure to other nonionizing radiation, sequela: Secondary | ICD-10-CM | POA: Diagnosis not present

## 2022-05-30 DIAGNOSIS — L821 Other seborrheic keratosis: Secondary | ICD-10-CM | POA: Diagnosis not present

## 2022-05-30 DIAGNOSIS — L814 Other melanin hyperpigmentation: Secondary | ICD-10-CM | POA: Diagnosis not present

## 2022-05-30 DIAGNOSIS — X32XXXS Exposure to sunlight, sequela: Secondary | ICD-10-CM | POA: Diagnosis not present

## 2022-05-30 DIAGNOSIS — L578 Other skin changes due to chronic exposure to nonionizing radiation: Secondary | ICD-10-CM | POA: Diagnosis not present

## 2022-06-01 DIAGNOSIS — M47816 Spondylosis without myelopathy or radiculopathy, lumbar region: Secondary | ICD-10-CM | POA: Diagnosis not present

## 2022-06-01 DIAGNOSIS — M5459 Other low back pain: Secondary | ICD-10-CM | POA: Diagnosis not present

## 2022-06-14 DIAGNOSIS — G894 Chronic pain syndrome: Secondary | ICD-10-CM | POA: Diagnosis not present

## 2022-06-14 DIAGNOSIS — M5136 Other intervertebral disc degeneration, lumbar region: Secondary | ICD-10-CM | POA: Diagnosis not present

## 2022-06-14 DIAGNOSIS — S93492A Sprain of other ligament of left ankle, initial encounter: Secondary | ICD-10-CM | POA: Diagnosis not present

## 2022-06-14 DIAGNOSIS — M5459 Other low back pain: Secondary | ICD-10-CM | POA: Diagnosis not present

## 2022-06-14 DIAGNOSIS — M47816 Spondylosis without myelopathy or radiculopathy, lumbar region: Secondary | ICD-10-CM | POA: Diagnosis not present

## 2022-06-14 DIAGNOSIS — M48061 Spinal stenosis, lumbar region without neurogenic claudication: Secondary | ICD-10-CM | POA: Diagnosis not present

## 2022-06-14 DIAGNOSIS — S93492D Sprain of other ligament of left ankle, subsequent encounter: Secondary | ICD-10-CM | POA: Diagnosis not present

## 2022-06-24 DIAGNOSIS — J0110 Acute frontal sinusitis, unspecified: Secondary | ICD-10-CM | POA: Diagnosis not present

## 2022-09-21 DIAGNOSIS — M25561 Pain in right knee: Secondary | ICD-10-CM | POA: Diagnosis not present

## 2022-09-21 DIAGNOSIS — M25661 Stiffness of right knee, not elsewhere classified: Secondary | ICD-10-CM | POA: Diagnosis not present

## 2022-09-21 DIAGNOSIS — M1711 Unilateral primary osteoarthritis, right knee: Secondary | ICD-10-CM | POA: Diagnosis not present

## 2022-09-21 DIAGNOSIS — R262 Difficulty in walking, not elsewhere classified: Secondary | ICD-10-CM | POA: Diagnosis not present

## 2022-09-28 DIAGNOSIS — M25561 Pain in right knee: Secondary | ICD-10-CM | POA: Diagnosis not present

## 2022-09-28 DIAGNOSIS — R262 Difficulty in walking, not elsewhere classified: Secondary | ICD-10-CM | POA: Diagnosis not present

## 2022-09-28 DIAGNOSIS — M25661 Stiffness of right knee, not elsewhere classified: Secondary | ICD-10-CM | POA: Diagnosis not present

## 2022-09-28 DIAGNOSIS — M1711 Unilateral primary osteoarthritis, right knee: Secondary | ICD-10-CM | POA: Diagnosis not present

## 2022-10-01 DIAGNOSIS — M47817 Spondylosis without myelopathy or radiculopathy, lumbosacral region: Secondary | ICD-10-CM | POA: Diagnosis not present

## 2022-10-01 DIAGNOSIS — M47816 Spondylosis without myelopathy or radiculopathy, lumbar region: Secondary | ICD-10-CM | POA: Diagnosis not present

## 2022-10-01 DIAGNOSIS — M5125 Other intervertebral disc displacement, thoracolumbar region: Secondary | ICD-10-CM | POA: Diagnosis not present

## 2022-10-01 DIAGNOSIS — M5136 Other intervertebral disc degeneration, lumbar region: Secondary | ICD-10-CM | POA: Diagnosis not present

## 2022-10-05 DIAGNOSIS — R262 Difficulty in walking, not elsewhere classified: Secondary | ICD-10-CM | POA: Diagnosis not present

## 2022-10-05 DIAGNOSIS — M25661 Stiffness of right knee, not elsewhere classified: Secondary | ICD-10-CM | POA: Diagnosis not present

## 2022-10-05 DIAGNOSIS — M1711 Unilateral primary osteoarthritis, right knee: Secondary | ICD-10-CM | POA: Diagnosis not present

## 2022-10-05 DIAGNOSIS — M25561 Pain in right knee: Secondary | ICD-10-CM | POA: Diagnosis not present

## 2022-10-12 DIAGNOSIS — M1711 Unilateral primary osteoarthritis, right knee: Secondary | ICD-10-CM | POA: Diagnosis not present

## 2022-10-12 DIAGNOSIS — R262 Difficulty in walking, not elsewhere classified: Secondary | ICD-10-CM | POA: Diagnosis not present

## 2022-10-12 DIAGNOSIS — M25661 Stiffness of right knee, not elsewhere classified: Secondary | ICD-10-CM | POA: Diagnosis not present

## 2022-10-12 DIAGNOSIS — M25561 Pain in right knee: Secondary | ICD-10-CM | POA: Diagnosis not present

## 2022-10-18 DIAGNOSIS — F5101 Primary insomnia: Secondary | ICD-10-CM | POA: Diagnosis not present

## 2022-10-19 DIAGNOSIS — M1711 Unilateral primary osteoarthritis, right knee: Secondary | ICD-10-CM | POA: Diagnosis not present

## 2022-10-19 DIAGNOSIS — R262 Difficulty in walking, not elsewhere classified: Secondary | ICD-10-CM | POA: Diagnosis not present

## 2022-10-19 DIAGNOSIS — M25561 Pain in right knee: Secondary | ICD-10-CM | POA: Diagnosis not present

## 2022-10-19 DIAGNOSIS — M25661 Stiffness of right knee, not elsewhere classified: Secondary | ICD-10-CM | POA: Diagnosis not present

## 2022-10-23 DIAGNOSIS — G4733 Obstructive sleep apnea (adult) (pediatric): Secondary | ICD-10-CM | POA: Diagnosis not present

## 2022-10-23 DIAGNOSIS — G4700 Insomnia, unspecified: Secondary | ICD-10-CM | POA: Diagnosis not present

## 2022-10-23 DIAGNOSIS — R32 Unspecified urinary incontinence: Secondary | ICD-10-CM | POA: Diagnosis not present

## 2022-10-23 DIAGNOSIS — I10 Essential (primary) hypertension: Secondary | ICD-10-CM | POA: Diagnosis not present

## 2022-10-23 DIAGNOSIS — E669 Obesity, unspecified: Secondary | ICD-10-CM | POA: Diagnosis not present

## 2022-10-23 DIAGNOSIS — M545 Low back pain, unspecified: Secondary | ICD-10-CM | POA: Diagnosis not present

## 2022-10-23 DIAGNOSIS — M1990 Unspecified osteoarthritis, unspecified site: Secondary | ICD-10-CM | POA: Diagnosis not present

## 2022-10-23 DIAGNOSIS — M792 Neuralgia and neuritis, unspecified: Secondary | ICD-10-CM | POA: Diagnosis not present

## 2022-10-23 DIAGNOSIS — K219 Gastro-esophageal reflux disease without esophagitis: Secondary | ICD-10-CM | POA: Diagnosis not present

## 2022-10-23 DIAGNOSIS — E785 Hyperlipidemia, unspecified: Secondary | ICD-10-CM | POA: Diagnosis not present

## 2022-10-23 DIAGNOSIS — M4800 Spinal stenosis, site unspecified: Secondary | ICD-10-CM | POA: Diagnosis not present

## 2022-10-23 DIAGNOSIS — F419 Anxiety disorder, unspecified: Secondary | ICD-10-CM | POA: Diagnosis not present

## 2022-11-05 DIAGNOSIS — H524 Presbyopia: Secondary | ICD-10-CM | POA: Diagnosis not present

## 2022-11-09 DIAGNOSIS — F5101 Primary insomnia: Secondary | ICD-10-CM | POA: Diagnosis not present

## 2022-11-15 DIAGNOSIS — E782 Mixed hyperlipidemia: Secondary | ICD-10-CM | POA: Diagnosis not present

## 2022-11-15 DIAGNOSIS — I10 Essential (primary) hypertension: Secondary | ICD-10-CM | POA: Diagnosis not present

## 2022-11-16 DIAGNOSIS — Z6831 Body mass index (BMI) 31.0-31.9, adult: Secondary | ICD-10-CM | POA: Diagnosis not present

## 2022-11-16 DIAGNOSIS — K219 Gastro-esophageal reflux disease without esophagitis: Secondary | ICD-10-CM | POA: Diagnosis not present

## 2022-11-16 DIAGNOSIS — I10 Essential (primary) hypertension: Secondary | ICD-10-CM | POA: Diagnosis not present

## 2022-11-16 DIAGNOSIS — G4733 Obstructive sleep apnea (adult) (pediatric): Secondary | ICD-10-CM | POA: Diagnosis not present

## 2022-11-16 DIAGNOSIS — E782 Mixed hyperlipidemia: Secondary | ICD-10-CM | POA: Diagnosis not present

## 2022-11-16 DIAGNOSIS — Z713 Dietary counseling and surveillance: Secondary | ICD-10-CM | POA: Diagnosis not present

## 2022-11-16 DIAGNOSIS — F325 Major depressive disorder, single episode, in full remission: Secondary | ICD-10-CM | POA: Diagnosis not present

## 2022-11-16 DIAGNOSIS — F5101 Primary insomnia: Secondary | ICD-10-CM | POA: Diagnosis not present

## 2022-11-16 DIAGNOSIS — M1990 Unspecified osteoarthritis, unspecified site: Secondary | ICD-10-CM | POA: Diagnosis not present

## 2022-12-03 DIAGNOSIS — F419 Anxiety disorder, unspecified: Secondary | ICD-10-CM | POA: Diagnosis not present

## 2022-12-03 DIAGNOSIS — I10 Essential (primary) hypertension: Secondary | ICD-10-CM | POA: Diagnosis not present

## 2022-12-03 DIAGNOSIS — F5101 Primary insomnia: Secondary | ICD-10-CM | POA: Diagnosis not present

## 2022-12-03 DIAGNOSIS — F4321 Adjustment disorder with depressed mood: Secondary | ICD-10-CM | POA: Diagnosis not present

## 2022-12-03 DIAGNOSIS — Z6831 Body mass index (BMI) 31.0-31.9, adult: Secondary | ICD-10-CM | POA: Diagnosis not present

## 2022-12-03 DIAGNOSIS — G4733 Obstructive sleep apnea (adult) (pediatric): Secondary | ICD-10-CM | POA: Diagnosis not present

## 2022-12-04 DIAGNOSIS — Z1509 Genetic susceptibility to other malignant neoplasm: Secondary | ICD-10-CM | POA: Diagnosis not present

## 2022-12-04 DIAGNOSIS — Z1501 Genetic susceptibility to malignant neoplasm of breast: Secondary | ICD-10-CM | POA: Diagnosis not present

## 2022-12-04 DIAGNOSIS — G4733 Obstructive sleep apnea (adult) (pediatric): Secondary | ICD-10-CM | POA: Diagnosis not present

## 2022-12-04 DIAGNOSIS — Z79899 Other long term (current) drug therapy: Secondary | ICD-10-CM | POA: Diagnosis not present

## 2022-12-04 DIAGNOSIS — I10 Essential (primary) hypertension: Secondary | ICD-10-CM | POA: Diagnosis not present

## 2022-12-04 DIAGNOSIS — Z87442 Personal history of urinary calculi: Secondary | ICD-10-CM | POA: Diagnosis not present

## 2022-12-04 DIAGNOSIS — Z791 Long term (current) use of non-steroidal anti-inflammatories (NSAID): Secondary | ICD-10-CM | POA: Diagnosis not present

## 2022-12-04 DIAGNOSIS — G44209 Tension-type headache, unspecified, not intractable: Secondary | ICD-10-CM | POA: Diagnosis not present

## 2022-12-04 DIAGNOSIS — Z800 Family history of malignant neoplasm of digestive organs: Secondary | ICD-10-CM | POA: Diagnosis not present

## 2022-12-04 DIAGNOSIS — K862 Cyst of pancreas: Secondary | ICD-10-CM | POA: Diagnosis not present

## 2022-12-18 DIAGNOSIS — F0631 Mood disorder due to known physiological condition with depressive features: Secondary | ICD-10-CM | POA: Diagnosis not present

## 2022-12-18 DIAGNOSIS — F419 Anxiety disorder, unspecified: Secondary | ICD-10-CM | POA: Diagnosis not present

## 2023-01-03 DIAGNOSIS — I10 Essential (primary) hypertension: Secondary | ICD-10-CM | POA: Diagnosis not present

## 2023-02-01 DIAGNOSIS — M47816 Spondylosis without myelopathy or radiculopathy, lumbar region: Secondary | ICD-10-CM | POA: Diagnosis not present

## 2023-02-01 DIAGNOSIS — G894 Chronic pain syndrome: Secondary | ICD-10-CM | POA: Diagnosis not present

## 2023-02-01 DIAGNOSIS — M5459 Other low back pain: Secondary | ICD-10-CM | POA: Diagnosis not present

## 2023-02-01 DIAGNOSIS — M48061 Spinal stenosis, lumbar region without neurogenic claudication: Secondary | ICD-10-CM | POA: Diagnosis not present

## 2023-02-01 DIAGNOSIS — M5136 Other intervertebral disc degeneration, lumbar region: Secondary | ICD-10-CM | POA: Diagnosis not present

## 2023-03-08 DIAGNOSIS — F419 Anxiety disorder, unspecified: Secondary | ICD-10-CM | POA: Diagnosis not present

## 2023-03-08 DIAGNOSIS — F330 Major depressive disorder, recurrent, mild: Secondary | ICD-10-CM | POA: Diagnosis not present

## 2023-04-29 DIAGNOSIS — M5459 Other low back pain: Secondary | ICD-10-CM | POA: Diagnosis not present

## 2023-04-29 DIAGNOSIS — M47816 Spondylosis without myelopathy or radiculopathy, lumbar region: Secondary | ICD-10-CM | POA: Diagnosis not present

## 2023-04-29 DIAGNOSIS — M461 Sacroiliitis, not elsewhere classified: Secondary | ICD-10-CM | POA: Diagnosis not present

## 2023-04-29 DIAGNOSIS — H6123 Impacted cerumen, bilateral: Secondary | ICD-10-CM | POA: Diagnosis not present

## 2023-04-29 DIAGNOSIS — M48061 Spinal stenosis, lumbar region without neurogenic claudication: Secondary | ICD-10-CM | POA: Diagnosis not present

## 2023-04-29 DIAGNOSIS — G894 Chronic pain syndrome: Secondary | ICD-10-CM | POA: Diagnosis not present

## 2023-05-02 DIAGNOSIS — R69 Illness, unspecified: Secondary | ICD-10-CM | POA: Diagnosis not present

## 2023-05-09 DIAGNOSIS — M545 Low back pain, unspecified: Secondary | ICD-10-CM | POA: Diagnosis not present

## 2023-05-13 DIAGNOSIS — F5101 Primary insomnia: Secondary | ICD-10-CM | POA: Diagnosis not present

## 2023-05-13 DIAGNOSIS — Z0000 Encounter for general adult medical examination without abnormal findings: Secondary | ICD-10-CM | POA: Diagnosis not present

## 2023-05-13 DIAGNOSIS — Z9013 Acquired absence of bilateral breasts and nipples: Secondary | ICD-10-CM | POA: Diagnosis not present

## 2023-05-13 DIAGNOSIS — L900 Lichen sclerosus et atrophicus: Secondary | ICD-10-CM | POA: Diagnosis not present

## 2023-05-13 DIAGNOSIS — E782 Mixed hyperlipidemia: Secondary | ICD-10-CM | POA: Diagnosis not present

## 2023-05-13 DIAGNOSIS — K219 Gastro-esophageal reflux disease without esophagitis: Secondary | ICD-10-CM | POA: Diagnosis not present

## 2023-05-13 DIAGNOSIS — I10 Essential (primary) hypertension: Secondary | ICD-10-CM | POA: Diagnosis not present

## 2023-05-13 DIAGNOSIS — F419 Anxiety disorder, unspecified: Secondary | ICD-10-CM | POA: Diagnosis not present

## 2023-05-14 DIAGNOSIS — M545 Low back pain, unspecified: Secondary | ICD-10-CM | POA: Diagnosis not present

## 2023-05-17 DIAGNOSIS — M545 Low back pain, unspecified: Secondary | ICD-10-CM | POA: Diagnosis not present

## 2023-05-20 DIAGNOSIS — F330 Major depressive disorder, recurrent, mild: Secondary | ICD-10-CM | POA: Diagnosis not present

## 2023-05-20 DIAGNOSIS — F419 Anxiety disorder, unspecified: Secondary | ICD-10-CM | POA: Diagnosis not present

## 2023-05-20 DIAGNOSIS — M545 Low back pain, unspecified: Secondary | ICD-10-CM | POA: Diagnosis not present

## 2023-05-20 DIAGNOSIS — F0631 Mood disorder due to known physiological condition with depressive features: Secondary | ICD-10-CM | POA: Diagnosis not present

## 2023-05-23 DIAGNOSIS — M545 Low back pain, unspecified: Secondary | ICD-10-CM | POA: Diagnosis not present

## 2023-05-27 DIAGNOSIS — M545 Low back pain, unspecified: Secondary | ICD-10-CM | POA: Diagnosis not present

## 2023-05-31 DIAGNOSIS — M545 Low back pain, unspecified: Secondary | ICD-10-CM | POA: Diagnosis not present

## 2023-06-03 DIAGNOSIS — L814 Other melanin hyperpigmentation: Secondary | ICD-10-CM | POA: Diagnosis not present

## 2023-06-03 DIAGNOSIS — Z129 Encounter for screening for malignant neoplasm, site unspecified: Secondary | ICD-10-CM | POA: Diagnosis not present

## 2023-06-03 DIAGNOSIS — L570 Actinic keratosis: Secondary | ICD-10-CM | POA: Diagnosis not present

## 2023-06-03 DIAGNOSIS — L821 Other seborrheic keratosis: Secondary | ICD-10-CM | POA: Diagnosis not present

## 2023-06-03 DIAGNOSIS — M545 Low back pain, unspecified: Secondary | ICD-10-CM | POA: Diagnosis not present

## 2023-06-05 DIAGNOSIS — F325 Major depressive disorder, single episode, in full remission: Secondary | ICD-10-CM | POA: Diagnosis not present

## 2023-06-05 DIAGNOSIS — F419 Anxiety disorder, unspecified: Secondary | ICD-10-CM | POA: Diagnosis not present

## 2023-06-05 DIAGNOSIS — G4733 Obstructive sleep apnea (adult) (pediatric): Secondary | ICD-10-CM | POA: Diagnosis not present

## 2023-06-05 DIAGNOSIS — Z6833 Body mass index (BMI) 33.0-33.9, adult: Secondary | ICD-10-CM | POA: Diagnosis not present

## 2023-06-07 DIAGNOSIS — M545 Low back pain, unspecified: Secondary | ICD-10-CM | POA: Diagnosis not present

## 2023-06-14 DIAGNOSIS — M545 Low back pain, unspecified: Secondary | ICD-10-CM | POA: Diagnosis not present
